# Patient Record
Sex: Female | Born: 1980 | Race: Black or African American | Hispanic: No | Marital: Single | Smoking: Never smoker
Health system: Southern US, Community
[De-identification: ages and names within clinical notes are randomized; demographics above are authoritative.]

## PROBLEM LIST (undated history)

## (undated) DIAGNOSIS — F419 Anxiety disorder, unspecified: Secondary | ICD-10-CM

## (undated) DIAGNOSIS — F329 Major depressive disorder, single episode, unspecified: Secondary | ICD-10-CM

## (undated) DIAGNOSIS — R569 Unspecified convulsions: Secondary | ICD-10-CM

## (undated) DIAGNOSIS — G40909 Epilepsy, unspecified, not intractable, without status epilepticus: Secondary | ICD-10-CM

## (undated) DIAGNOSIS — S329XXA Fracture of unspecified parts of lumbosacral spine and pelvis, initial encounter for closed fracture: Secondary | ICD-10-CM

## (undated) DIAGNOSIS — R002 Palpitations: Secondary | ICD-10-CM

## (undated) HISTORY — DX: Anxiety disorder, unspecified: F41.9

## (undated) HISTORY — PX: HERNIA REPAIR: SHX51

## (undated) HISTORY — DX: Major depressive disorder, single episode, unspecified: F32.9

## (undated) HISTORY — DX: Fracture of unspecified parts of lumbosacral spine and pelvis, initial encounter for closed fracture: S32.9XXA

## (undated) HISTORY — DX: Palpitations: R00.2

## (undated) HISTORY — PX: LEG SURGERY: SHX1003

## (undated) HISTORY — DX: Epilepsy, unspecified, not intractable, without status epilepticus: G40.909

---
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
## 2003-03-17 ENCOUNTER — Other Ambulatory Visit: Payer: Self-pay

## 2004-02-22 ENCOUNTER — Emergency Department: Payer: Self-pay | Admitting: Emergency Medicine

## 2004-02-22 ENCOUNTER — Other Ambulatory Visit: Payer: Self-pay

## 2004-06-20 ENCOUNTER — Emergency Department: Payer: Self-pay | Admitting: General Practice

## 2004-09-20 ENCOUNTER — Observation Stay: Payer: Self-pay | Admitting: Unknown Physician Specialty

## 2004-10-18 ENCOUNTER — Observation Stay: Payer: Self-pay | Admitting: Unknown Physician Specialty

## 2004-12-09 ENCOUNTER — Observation Stay: Payer: Self-pay

## 2005-01-19 ENCOUNTER — Inpatient Hospital Stay: Payer: Self-pay | Admitting: Obstetrics & Gynecology

## 2005-03-21 ENCOUNTER — Other Ambulatory Visit: Payer: Self-pay

## 2005-03-21 ENCOUNTER — Emergency Department: Payer: Self-pay | Admitting: Emergency Medicine

## 2006-02-14 ENCOUNTER — Emergency Department: Payer: Self-pay | Admitting: Emergency Medicine

## 2006-05-28 ENCOUNTER — Emergency Department: Payer: Self-pay | Admitting: Emergency Medicine

## 2007-09-09 ENCOUNTER — Ambulatory Visit: Payer: Self-pay | Admitting: Internal Medicine

## 2007-11-14 ENCOUNTER — Ambulatory Visit: Payer: Self-pay | Admitting: Internal Medicine

## 2007-12-05 ENCOUNTER — Emergency Department: Payer: Self-pay

## 2007-12-05 ENCOUNTER — Other Ambulatory Visit: Payer: Self-pay

## 2008-05-13 ENCOUNTER — Emergency Department: Payer: Self-pay | Admitting: Emergency Medicine

## 2008-05-29 ENCOUNTER — Ambulatory Visit: Payer: Self-pay | Admitting: Internal Medicine

## 2010-08-01 ENCOUNTER — Emergency Department: Payer: Self-pay | Admitting: Emergency Medicine

## 2010-08-15 ENCOUNTER — Encounter: Payer: Self-pay | Admitting: Obstetrics and Gynecology

## 2010-08-19 ENCOUNTER — Emergency Department: Payer: Self-pay | Admitting: Emergency Medicine

## 2011-01-10 DIAGNOSIS — G40909 Epilepsy, unspecified, not intractable, without status epilepticus: Secondary | ICD-10-CM

## 2011-01-10 DIAGNOSIS — S329XXA Fracture of unspecified parts of lumbosacral spine and pelvis, initial encounter for closed fracture: Secondary | ICD-10-CM | POA: Insufficient documentation

## 2011-01-10 DIAGNOSIS — F32A Depression, unspecified: Secondary | ICD-10-CM

## 2011-01-10 DIAGNOSIS — F419 Anxiety disorder, unspecified: Secondary | ICD-10-CM | POA: Insufficient documentation

## 2011-01-10 HISTORY — DX: Fracture of unspecified parts of lumbosacral spine and pelvis, initial encounter for closed fracture: S32.9XXA

## 2011-01-10 HISTORY — DX: Anxiety disorder, unspecified: F41.9

## 2011-01-10 HISTORY — DX: Epilepsy, unspecified, not intractable, without status epilepticus: G40.909

## 2011-01-10 HISTORY — DX: Depression, unspecified: F32.A

## 2011-03-15 ENCOUNTER — Observation Stay: Payer: Self-pay | Admitting: Obstetrics and Gynecology

## 2011-03-21 ENCOUNTER — Ambulatory Visit: Payer: Self-pay | Admitting: Obstetrics & Gynecology

## 2011-03-22 ENCOUNTER — Inpatient Hospital Stay: Payer: Self-pay | Admitting: Obstetrics & Gynecology

## 2011-03-23 LAB — PATHOLOGY REPORT

## 2011-07-11 ENCOUNTER — Emergency Department: Payer: Self-pay | Admitting: *Deleted

## 2012-04-25 ENCOUNTER — Emergency Department: Payer: Self-pay | Admitting: Emergency Medicine

## 2012-08-03 ENCOUNTER — Emergency Department: Payer: Self-pay | Admitting: Emergency Medicine

## 2013-03-30 ENCOUNTER — Emergency Department: Payer: Self-pay | Admitting: Emergency Medicine

## 2014-06-03 ENCOUNTER — Emergency Department: Payer: Self-pay | Admitting: Emergency Medicine

## 2015-02-03 ENCOUNTER — Ambulatory Visit
Admission: EM | Admit: 2015-02-03 | Discharge: 2015-02-03 | Disposition: A | Discharge status: Home / Self Care | Payer: Managed Care, Other (non HMO) | Attending: Family Medicine | Admitting: Family Medicine

## 2015-02-03 DIAGNOSIS — J0100 Acute maxillary sinusitis, unspecified: Secondary | ICD-10-CM | POA: Diagnosis not present

## 2015-02-03 HISTORY — DX: Unspecified convulsions: R56.9

## 2015-02-03 MED ORDER — AMOXICILLIN-POT CLAVULANATE 875-125 MG PO TABS: 1.0000 | ORAL_TABLET | Freq: Two times a day (BID) | ORAL | Status: DC

## 2015-02-03 MED ORDER — IBUPROFEN 800 MG PO TABS: 800.0000 mg | ORAL_TABLET | Freq: Three times a day (TID) | ORAL | Status: DC | PRN

## 2015-02-03 NOTE — Discharge Instructions (Signed)
Take medication as prescribed. Rest. Drink plenty of water.   Follow up your primary care physician as needed. Return to Urgent for new or worsening concerns.   Sinusitis, Adult Sinusitis is redness, soreness, and inflammation of the paranasal sinuses. Paranasal sinuses are air pockets within the bones of your face. They are located beneath your eyes, in the middle of your forehead, and above your eyes. In healthy paranasal sinuses, mucus is able to drain out, and air is able to circulate through them by way of your nose. However, when your paranasal sinuses are inflamed, mucus and air can become trapped. This can allow bacteria and other germs to grow and cause infection. Sinusitis can develop quickly and last only a short time (acute) or continue over a long period (chronic). Sinusitis that lasts for more than 12 weeks is considered chronic. CAUSES Causes of sinusitis include:  Allergies.  Structural abnormalities, such as displacement of the cartilage that separates your nostrils (deviated septum), which can decrease the air flow through your nose and sinuses and affect sinus drainage.  Functional abnormalities, such as when the small hairs (cilia) that line your sinuses and help remove mucus do not work properly or are not present. SIGNS AND SYMPTOMS Symptoms of acute and chronic sinusitis are the same. The primary symptoms are pain and pressure around the affected sinuses. Other symptoms include:  Upper toothache.  Earache.  Headache.  Bad breath.  Decreased sense of smell and taste.  A cough, which worsens when you are lying flat.  Fatigue.  Fever.  Thick drainage from your nose, which often is green and may contain pus (purulent).  Swelling and warmth over the affected sinuses. DIAGNOSIS Your health care provider will perform a physical exam. During your exam, your health care provider may perform any of the following to help determine if you have acute sinusitis or  chronic sinusitis:  Look in your nose for signs of abnormal growths in your nostrils (nasal polyps).  Tap over the affected sinus to check for signs of infection.  View the inside of your sinuses using an imaging device that has a light attached (endoscope). If your health care provider suspects that you have chronic sinusitis, one or more of the following tests may be recommended:  Allergy tests.  Nasal culture. A sample of mucus is taken from your nose, sent to a lab, and screened for bacteria.  Nasal cytology. A sample of mucus is taken from your nose and examined by your health care provider to determine if your sinusitis is related to an allergy. TREATMENT Most cases of acute sinusitis are related to a viral infection and will resolve on their own within 10 days. Sometimes, medicines are prescribed to help relieve symptoms of both acute and chronic sinusitis. These may include pain medicines, decongestants, nasal steroid sprays, or saline sprays. However, for sinusitis related to a bacterial infection, your health care provider will prescribe antibiotic medicines. These are medicines that will help kill the bacteria causing the infection. Rarely, sinusitis is caused by a fungal infection. In these cases, your health care provider will prescribe antifungal medicine. For some cases of chronic sinusitis, surgery is needed. Generally, these are cases in which sinusitis recurs more than 3 times per year, despite other treatments. HOME CARE INSTRUCTIONS  Drink plenty of water. Water helps thin the mucus so your sinuses can drain more easily.  Use a humidifier.  Inhale steam 3-4 times a day (for example, sit in the bathroom with the shower  running).  Apply a warm, moist washcloth to your face 3-4 times a day, or as directed by your health care provider.  Use saline nasal sprays to help moisten and clean your sinuses.  Take medicines only as directed by your health care provider.  If  you were prescribed either an antibiotic or antifungal medicine, finish it all even if you start to feel better. SEEK IMMEDIATE MEDICAL CARE IF:  You have increasing pain or severe headaches.  You have nausea, vomiting, or drowsiness.  You have swelling around your face.  You have vision problems.  You have a stiff neck.  You have difficulty breathing.   This information is not intended to replace advice given to you by your health care provider. Make sure you discuss any questions you have with your health care provider.   Document Released: 03/27/2005 Document Revised: 04/17/2014 Document Reviewed: 04/11/2011 Elsevier Interactive Patient Education [employer].

## 2015-02-03 NOTE — ED Notes (Signed)
Started 1 week ago yesterday with sore throat. Slight laryngitis the next day. Progressed to sinus congestion and facial pressure. + productive yellow cough since Saturday. Not improving

## 2015-02-03 NOTE — ED Provider Notes (Signed)
[facility] Emergency Department Provider Note  ____________________________________________  Time seen: Approximately 1755 PM  I have reviewed the triage vital signs and the nursing notes.   HISTORY  Chief Complaint URI   HPI Shelby Myers is a 34 y.o. female presents for 8-9 days of runny nose, cough, congestion. Patient reports some sore throat as well. States cough is occasional and primarily associated with drainage in the back of throat. States constant sinus pressure over the last few days. Unrelieved by over-the-counter Tylenol cold and sinus as well as over-the-counter Mucinex. States that these medicines did tend to help but did not resolve symptoms. Denies known sick contacts. Denies known fever. Reports continues to drink fluids well however with slight decrease in appetite and states that foods just do not taste as well.  Denies chest pain or shortness of breath. Denies abdominal pain. Denies rash, dizziness, vision changes, weakness, neck or back pain. Reports current pain is 4 out of 10 described as a pressure in the front sinuses. Denies recent antibiotic use.   Past Medical History  Diagnosis Date  . Seizures (HCC)   States epilepsy. States controlled. States does not take medication for seizures as seizures very infrequent. States last seizure greater than 3 years ago.   There are no active problems to display for this patient.   History reviewed. No pertinent past surgical history.  Current Outpatient Rx  Name  Route  Sig  Dispense  Refill  .           Marland Kitchen             Allergies Benadryl  Family History  Problem Relation Age of Onset  . Diabetes Mother   . Hypertension Mother   . Asthma Mother   . Seizures Mother     Social History Social History  Substance Use Topics  . Smoking status: Never Smoker   . Smokeless tobacco: None  . Alcohol Use: No    Review of Systems Constitutional: No fever/chills Eyes: No visual  changes. ENT: positive runny nose, congestion, intermittent sore throat.  Cardiovascular: Denies chest pain. Respiratory: Denies shortness of breath. Positive intermittent cough.  Gastrointestinal: No abdominal pain.  No nausea, no vomiting.  No diarrhea.  No constipation. Genitourinary: Negative for dysuria. Musculoskeletal: Negative for back pain. Skin: Negative for rash. Neurological: Negative for headaches, focal weakness or numbness.  10-point ROS otherwise negative.  ____________________________________________   PHYSICAL EXAM:  VITAL SIGNS: ED Triage Vitals  Enc Vitals Group     BP 02/03/15 1657 114/96 mmHg     Pulse Rate 02/03/15 1657 54     Resp 02/03/15 1657 17     Temp 02/03/15 1657 98.7 F (37.1 C)     Temp Source 02/03/15 1657 Tympanic     SpO2 02/03/15 1657 100 %     Weight 02/03/15 1657 185 lb (83.915 kg)     Height 02/03/15 1657  (1.702 m)     Head Cir --      Peak Flow --      Pain Score 02/03/15 1700 6     Pain Loc --      Pain Edu? --      Excl. in GC? --     Constitutional: Alert and oriented. Well appearing and in no acute distress. Eyes: Conjunctivae are normal. PERRL. EOMI. Head: Atraumatic.Mod TTP bilateral maxillary sinuses and mild bilateral frontal sinuses, no swelling, no erythema.   Ears: no erythema, normal TMs bilaterally.  Nose: nasal congestion, nasal turbinate erythema and bogginess. Nares patent.   Mouth/Throat: Mucous membranes are moist.  Mild pharyngeal erythema, no tonsillar swelling or exudate. No uvular deviation or shift.  Neck: No stridor.  No cervical spine tenderness to palpation. Hematological/Lymphatic/Immunilogical: No cervical lymphadenopathy. Cardiovascular: Normal rate, regular rhythm. Grossly normal heart sounds.  Good peripheral circulation. Respiratory: Normal respiratory effort.  No retractions. Lungs CTAB. No wheezes, rales, or rhonchi.  Gastrointestinal: Soft and nontender. No distention. Normal Bowel  sounds.  No CVA tenderness. Musculoskeletal: No lower or upper extremity tenderness nor edema. Bilateral pedal pulses equal and easily palpated.   Neurologic:  Normal speech and language. No gross focal neurologic deficits are appreciated. No gait instability. Skin:  Skin is warm, dry and intact. No rash noted. Psychiatric: Mood and affect are normal. Speech and behavior are normal.  ____________________________________________   LABS (all labs ordered are listed, but only abnormal results are displayed)  Labs Reviewed - No data to display   INITIAL IMPRESSION / ASSESSMENT AND PLAN / ED COURSE  Pertinent labs & imaging results that were available during my care of the patient were reviewed by me and considered in my medical decision making (see chart for details).  Very well appearing. No acute distress. Presents for complaints of 8-9 days of runny nose, congestion and sinus pressure with intermittent cough, and sore throat. Will treat with oral Augmentin, prn ibuprofen and tylenol, rest and fluids. Discussed follow up with Primary care physician this week. Discussed follow up and return parameters including no resolution or any worsening concerns. Patient verbalized understanding and agreed to plan.   ____________________________________________   FINAL CLINICAL IMPRESSION(S) / ED DIAGNOSES  Final diagnoses:  Acute maxillary sinusitis, recurrence not specified       Renford DillsLindsey Kimberly Coye, NP 02/03/15 1906

## 2015-02-03 NOTE — ED Notes (Signed)
Remains waiting to be seen. States "has to be somewhere at 6:15pm". Providers notified.

## 2015-03-30 ENCOUNTER — Encounter: Payer: Self-pay | Admitting: Emergency Medicine

## 2015-03-30 ENCOUNTER — Ambulatory Visit
Admission: EM | Admit: 2015-03-30 | Discharge: 2015-03-30 | Disposition: A | Discharge status: Home / Self Care | Payer: Managed Care, Other (non HMO) | Attending: Family Medicine | Admitting: Family Medicine

## 2015-03-30 DIAGNOSIS — T7840XD Allergy, unspecified, subsequent encounter: Secondary | ICD-10-CM | POA: Diagnosis not present

## 2015-03-30 DIAGNOSIS — R21 Rash and other nonspecific skin eruption: Secondary | ICD-10-CM | POA: Diagnosis not present

## 2015-03-30 MED ORDER — METHYLPREDNISOLONE 4 MG PO TBPK: ORAL_TABLET | ORAL | Status: DC

## 2015-03-30 MED ORDER — FEXOFENADINE HCL 60 MG PO TABS: 60.0000 mg | ORAL_TABLET | Freq: Two times a day (BID) | ORAL | Status: DC

## 2015-03-30 NOTE — ED Notes (Signed)
Patient c/o itchy red rash around her mouth that started Saturday.

## 2015-03-30 NOTE — ED Provider Notes (Signed)
CSN: [id number]     Arrival date & time 03/30/15  1809 History   First MD Initiated Contact with Patient 03/30/15 1835     Chief Complaint  Patient presents with  . Rash   (Consider location/radiation/quality/duration/timing/severity/associated sxs/prior Treatment) HPI   34 year old female who presents with a three-day history of circumoral itching swelling and small bumps that occurred after she drank some blueberry punch that was alcoholic a party. She states that she had blueberry in the past but may have had other ingredients that she was unaware of. She states that she had become very flushed and had a rash over most of her entire face; and a nurse at the party applied cool compresses which seemed to help somewhat. However the burning and itching around her lips has persisted and she has noticed some very fine small bumps that are bothersome and embarrassing to her. She states she had no respiratory symptoms at all and her lips did not swell immensely. She is allergic to Benadryl which causes itching. She's been using only Chapstick to keep her lips moist.  Past Medical History  Diagnosis Date  . Seizures (HCC)    History reviewed. No pertinent past surgical history. Family History  Problem Relation Age of Onset  . Diabetes Mother   . Hypertension Mother   . Asthma Mother   . Seizures Mother    Social History  Substance Use Topics  . Smoking status: Never Smoker   . Smokeless tobacco: None  . Alcohol Use: No   OB History    No data available     Review of Systems  Constitutional: Negative for fever, chills, activity change and fatigue.  HENT: Positive for mouth sores.   Skin: Positive for rash.  All other systems reviewed and are negative.   Allergies  Benadryl  Home Medications   Prior to Admission medications   Medication Sig Start Date End Date Taking? Authorizing Provider  amoxicillin-clavulanate (AUGMENTIN) 875-125 MG tablet Take 1 tablet by mouth every 12  (twelve) hours. 02/03/15   Renford Dills, NP  fexofenadine (ALLEGRA) 60 MG tablet Take 1 tablet (60 mg total) by mouth 2 (two) times daily. 03/30/15   Lutricia Feil, PA-C  ibuprofen (ADVIL,MOTRIN) 800 MG tablet Take 1 tablet (800 mg total) by mouth every 8 (eight) hours as needed for mild pain or moderate pain. 02/03/15   Renford Dills, NP  methylPREDNISolone (MEDROL DOSEPAK) 4 MG TBPK tablet Take per package instructions 03/30/15   Lutricia Feil, PA-C   Meds Ordered and Administered this Visit  Medications - No data to display  BP 109/61 mmHg  Pulse 62  Temp(Src) 98.1 F (36.7 C) (Tympanic)  Resp 16  Ht  (1.702 m)  Wt 185 lb (83.915 kg)  BMI 28.97 kg/m2  SpO2 100%  LMP 03/30/2015 (Exact Date) No data found.   Physical Exam  Constitutional: She is oriented to person, place, and time. She appears well-developed and well-nourished. No distress.  HENT:  Head: Normocephalic and atraumatic.  Eyes: Pupils are equal, round, and reactive to light.  Neck: Normal range of motion. Neck supple.  Musculoskeletal: Normal range of motion. She exhibits no edema or tenderness.  Neurological: She is alert and oriented to person, place, and time.  Skin: Rash noted. She is not diaphoretic.  Examination of the patient's face shows no obvious rash grossly but upon closer observation shows some small papular nonvesicular circumoral fine rash present. No swelling of the lips. The mucosal surfaces are  normal and free of any rash. She has no excoriations. He is having no breathing problems.  Psychiatric: She has a normal mood and affect. Her behavior is normal. Judgment and thought content normal.  Nursing note and vitals reviewed.   ED Course  Procedures (including critical care time)  Labs Review Labs Reviewed - No data to display  Imaging Review No results found.   Visual Acuity Review  Right Eye Distance:   Left Eye Distance:   Bilateral Distance:    Right Eye Near:   Left  Eye Near:    Bilateral Near:         MDM   1. Allergic reaction, subsequent encounter    Discharge Medication List as of 03/30/2015  7:02 PM    START taking these medications   Details  fexofenadine (ALLEGRA) 60 MG tablet Take 1 tablet (60 mg total) by mouth 2 (two) times daily., Starting 03/30/2015, Until Discontinued, Normal    methylPREDNISolone (MEDROL DOSEPAK) 4 MG TBPK tablet Take per package instructions, Normal      Plan: 1. Test/x-ray results and diagnosis reviewed with patient 2. rx as per orders; risks, benefits, potential side effects reviewed with patient 3. Recommend supportive treatment with the use of Chapstick to keep her lips moist. I recommended a H2 blocker. Since she is allergic to Benadryl I have asked her to try Allegra. 4. F/u  with a dermatologist (Dr. Ebony CargoBenitez Graham) if she is not improved in several days.     Lutricia FeilWilliam P Franceska Strahm, PA-C 03/30/15 [phone]

## 2015-07-07 ENCOUNTER — Ambulatory Visit: Payer: Managed Care, Other (non HMO)

## 2015-07-07 ENCOUNTER — Encounter (INDEPENDENT_AMBULATORY_CARE_PROVIDER_SITE_OTHER): Payer: Managed Care, Other (non HMO) | Admitting: Podiatry

## 2015-07-07 DIAGNOSIS — M79673 Pain in unspecified foot: Secondary | ICD-10-CM

## 2015-07-07 NOTE — Progress Notes (Signed)
This encounter was created in error - please disregard.

## 2015-07-17 ENCOUNTER — Emergency Department
Admission: EM | Admit: 2015-07-17 | Discharge: 2015-07-17 | Disposition: A | Discharge status: Home / Self Care | Payer: Managed Care, Other (non HMO) | Attending: Emergency Medicine | Admitting: Emergency Medicine

## 2015-07-17 ENCOUNTER — Encounter: Payer: Self-pay | Admitting: *Deleted

## 2015-07-17 ENCOUNTER — Emergency Department: Payer: Managed Care, Other (non HMO)

## 2015-07-17 DIAGNOSIS — I739 Peripheral vascular disease, unspecified: Secondary | ICD-10-CM | POA: Diagnosis present

## 2015-07-17 DIAGNOSIS — T148XXA Other injury of unspecified body region, initial encounter: Secondary | ICD-10-CM

## 2015-07-17 DIAGNOSIS — M7981 Nontraumatic hematoma of soft tissue: Secondary | ICD-10-CM | POA: Insufficient documentation

## 2015-07-17 HISTORY — DX: Fracture of unspecified parts of lumbosacral spine and pelvis, initial encounter for closed fracture: S32.9XXA

## 2015-07-17 MED ORDER — CYCLOBENZAPRINE HCL 10 MG PO TABS: 10.0000 mg | ORAL_TABLET | Freq: Three times a day (TID) | ORAL | Status: AC | PRN

## 2015-07-17 MED ORDER — IBUPROFEN 800 MG PO TABS: 800.0000 mg | ORAL_TABLET | Freq: Three times a day (TID) | ORAL | Status: DC | PRN

## 2015-07-17 NOTE — ED Notes (Signed)
Pt states injury to R calf x 1 month ago, struck on medial aspect of R calf at time by a softball. Pt states she suffered mild bruising at time. Pt states third week after injury she began to have increased pain. Pt states now she is experiencing swelling to R calf and R ankle that she characterizes as extreme and has never experienced this before. Pt c/o decreased mobility related to pain and swelling.

## 2015-07-17 NOTE — ED Notes (Signed)
Per first nurse, pt currently in [place]

## 2015-07-17 NOTE — Discharge Instructions (Signed)
serSeroma A seroma is a collection of fluid that looks like swelling or a mass on the body. Seromas form on the body where tissue has been injured or cut. They are most common after surgeries. Seromas vary in size. Some are small and painless. Others may become large and cause pain or discomfort. Many seromas go away on their own; the fluid is naturally absorbed by the body. Some may require the fluid to be drained through medical procedures.  CAUSES  Seromas form as the result of damage to tissue or the removal of tissue. This tissue damage may occur during surgery or because of an injury or trauma. When tissue is disrupted or removed, empty space is created. The body's natural defense system causes fluid to enter the empty space and form a seroma. SYMPTOMS   Swelling at the site of a surgical cut (incision) or an injury.  Drainage of clear fluid at the surgery or injury site.  Possible discomfort or pain. DIAGNOSIS  Your health care provider will perform a physical exam. During the exam, the health care provider will press on the seroma using a hand or fingers (palpation). Various tests may be ordered to help confirm the diagnosis. These tests may include:  Blood tests.  Imaging tests such as ultrasonography or computed tomography (CT). TREATMENT  Sometimes seromas resolve on their own and drain naturally in the body. Your health care provider may monitor you to make sure the seroma does not cause any complications. If your seroma does not resolve on its own, treatment may include:  Using a needle to drain the fluid from the seroma (needle aspiration).  Inserting a flexible tube (catheter) to drain the fluid.  Applying a dressing, such as an elastic bandage or binder.  Use of antibiotic medicines if the seroma becomes infected.  In rare cases, surgery may be done to remove the seroma and repair the area. HOME CARE INSTRUCTIONS  Follow your health care provider's instructions  regarding activity levels and any limitations on movements.  Only take over-the-counter or prescription medicines as directed by your health care provider.  If your health care provider prescribes antibiotics, take them as directed. Finish them even if you start to feel better.  Check your seroma every day for redness, warmth, or yellow drainage.  Follow up with your health care provider as directed. SEEK MEDICAL CARE IF:  You develop a fever.  You have pain, tenderness, redness, or warmth at the site of the seroma.  You notice yellow drainage coming from the site of the seroma.  Your seroma is getting bigger.   This information is not intended to replace advice given to you by your health care provider. Make sure you discuss any questions you have with your health care provider.   Document Released: 07/22/2012 Document Revised: 04/17/2014 Document Reviewed: 07/22/2012 Elsevier Interactive Patient Education 2016 [employer] Hematoma A hematoma is a collection of blood under the skin, in an organ, in a body space, in a joint space, or in other tissue. The blood can clot to form a lump that you can see and feel. The lump is often firm and may sometimes become sore and tender. Most hematomas get better in a few days to weeks. However, some hematomas may be serious and require medical care. Hematomas can range in size from very small to very large. CAUSES  A hematoma can be caused by a blunt or penetrating injury. It can also be caused by spontaneous leakage from a blood  vessel under the skin. Spontaneous leakage from a blood vessel is more likely to occur in older people, especially those taking blood thinners. Sometimes, a hematoma can develop after certain medical procedures. SIGNS AND SYMPTOMS   A firm lump on the body.  Possible pain and tenderness in the area.  Bruising.Blue, dark blue, purple-red, or yellowish skin may appear at the site of the hematoma if the hematoma is  close to the surface of the skin. For hematomas in deeper tissues or body spaces, the signs and symptoms may be subtle. For example, an intra-abdominal hematoma may cause abdominal pain, weakness, fainting, and shortness of breath. An intracranial hematoma may cause a headache or symptoms such as weakness, trouble speaking, or a change in consciousness. DIAGNOSIS  A hematoma can usually be diagnosed based on your medical history and a physical exam. Imaging tests may be needed if your health care provider suspects a hematoma in deeper tissues or body spaces, such as the abdomen, head, or chest. These tests may include ultrasonography or a CT scan.  TREATMENT  Hematomas usually go away on their own over time. Rarely does the blood need to be drained out of the body. Large hematomas or those that may affect vital organs will sometimes need surgical drainage or monitoring. HOME CARE INSTRUCTIONS   Apply ice to the injured area:   Put ice in a plastic bag.   Place a towel between your skin and the bag.   Leave the ice on for 20 minutes, 2-3 times a day for the first 1 to 2 days.   After the first 2 days, switch to using warm compresses on the hematoma.   Elevate the injured area to help decrease pain and swelling. Wrapping the area with an elastic bandage may also be helpful. Compression helps to reduce swelling and promotes shrinking of the hematoma. Make sure the bandage is not wrapped too tight.   If your hematoma is on a lower extremity and is painful, crutches may be helpful for a couple days.   Only take over-the-counter or prescription medicines as directed by your health care provider. SEEK IMMEDIATE MEDICAL CARE IF:   You have increasing pain, or your pain is not controlled with medicine.   You have a fever.   You have worsening swelling or discoloration.   Your skin over the hematoma breaks or starts bleeding.   Your hematoma is in your chest or abdomen and you have  weakness, shortness of breath, or a change in consciousness.  Your hematoma is on your scalp (caused by a fall or injury) and you have a worsening headache or a change in alertness or consciousness. MAKE SURE YOU:   Understand these instructions.  Will watch your condition.  Will get help right away if you are not doing well or get worse.   This information is not intended to replace advice given to you by your health care provider. Make sure you discuss any questions you have with your health care provider.   Document Released: 11/09/2003 Document Revised: 11/27/2012 Document Reviewed: 09/04/2012 Elsevier Interactive Patient Education [employer]

## 2015-07-17 NOTE — ED Provider Notes (Signed)
[facility] Emergency Department Provider Note     Time seen: ----------------------------------------- 9:43 PM on 07/17/2015 -----------------------------------------    I have reviewed the triage vital signs and the nursing notes.   HISTORY  Chief Complaint Claudication    HPI Shelby Myers is a 35 y.o. female who presents to ER for right calf pain for the last month. Patient states it was struck in the medial aspect of the right calf at that time by softball, causing some bruising. Patient states the third week after the injury she had increased pain now she's noticed increased swelling to the right calf and ankle that she thinks is extreme. She denies history of same, complains of decreased mobility related to this.   Past Medical History  Diagnosis Date  . Seizures (HCC)   . Pelvic avulsion fracture [facility](HCC)     Patient Active Problem List   Diagnosis Date Noted  . Anxiety and depression 01/10/2011  . Fracture of pelvis (HCC) 01/10/2011  . Seizure disorder (HCC) 01/10/2011    History reviewed. No pertinent past surgical history.  Allergies Benadryl and Diphenhydramine hcl  Social History Social History  Substance Use Topics  . Smoking status: Never Smoker   . Smokeless tobacco: Never Used  . Alcohol Use: No    Review of Systems Constitutional: Negative for fever. Eyes: Negative for visual changes. ENT: Negative for sore throat. Cardiovascular: Negative for chest pain. Respiratory: Negative for shortness of breath. Gastrointestinal: Negative for abdominal pain, vomiting and diarrhea. Genitourinary: Negative for dysuria. Musculoskeletal: Positive for right leg swelling and pain Skin: Negative for rash. Neurological: Negative for headaches, focal weakness or numbness.  10-point ROS otherwise negative.  ____________________________________________   PHYSICAL EXAM:  VITAL SIGNS: ED Triage Vitals  Enc Vitals Group     BP  07/17/15 2044 126/80 mmHg     Pulse Rate 07/17/15 2044 65     Resp 07/17/15 2044 16     Temp 07/17/15 2044 98.9 F (37.2 C)     Temp Source 07/17/15 2044 Oral     SpO2 07/17/15 2044 100 %     Weight 07/17/15 2044 190 lb (86.183 kg)     Height 07/17/15 2044 5\' 7"  (1.702 m)     Head Cir --      Peak Flow --      Pain Score 07/17/15 2057 6     Pain Loc --      Pain Edu? --      Excl. in GC? --     Constitutional: Alert and oriented. Well appearing and in no distress. Musculoskeletal: There is a small area on the right anterior medial calf that is tender, resembles hematoma Neurologic:  Normal speech and language. No gross focal neurologic deficits are appreciated.  Skin:  Skin is warm, dry and intact. Skin overlying the right lower extremity is normal Psychiatric: Mood and affect are normal ____________________________________________  ED COURSE:  Pertinent labs & imaging results that were available during my care of the patient were reviewed by me and considered in my medical decision making (see chart for details). Patient with posttraumatic right calf swelling, we will assess with ultrasound for DVT. ____________________________________________   RADIOLOGY Images were viewed by me  Ultrasound of the right lower extremity, right tib-fib x-rays IMPRESSION: No evidence of deep venous thrombosis.  There is a subcutaneous collection corresponding to the palpable abnormality, predominantly cystic. This could represent organizing hematoma or seroma. ____________________________________________  FINAL ASSESSMENT AND PLAN  Resolving Hematoma  Plan: Patient with imaging as dictated above. Patient is in no acute distress, x-rays are negative, ultrasound reveals a complex fluid collection that is not infectious. This is posttraumatic and is a resolving hematoma. Unfortunately treatment is limited at this time with Motrin, ice and elevation.   Emily Filbert,  MD   Emily Filbert, MD 07/17/15 [phone]

## 2015-07-17 NOTE — ED Notes (Signed)
Portable xray at bedside.

## 2015-07-31 ENCOUNTER — Encounter (HOSPITAL_COMMUNITY): Payer: Self-pay | Admitting: Family Medicine

## 2015-07-31 ENCOUNTER — Emergency Department (HOSPITAL_COMMUNITY)
Admission: EM | Admit: 2015-07-31 | Discharge: 2015-07-31 | Disposition: A | Discharge status: Home / Self Care | Payer: Managed Care, Other (non HMO) | Attending: Emergency Medicine | Admitting: Emergency Medicine

## 2015-07-31 ENCOUNTER — Emergency Department (HOSPITAL_COMMUNITY): Payer: Managed Care, Other (non HMO)

## 2015-07-31 DIAGNOSIS — Y9389 Activity, other specified: Secondary | ICD-10-CM | POA: Insufficient documentation

## 2015-07-31 DIAGNOSIS — Y998 Other external cause status: Secondary | ICD-10-CM | POA: Insufficient documentation

## 2015-07-31 DIAGNOSIS — S8011XA Contusion of right lower leg, initial encounter: Secondary | ICD-10-CM | POA: Insufficient documentation

## 2015-07-31 DIAGNOSIS — Z791 Long term (current) use of non-steroidal anti-inflammatories (NSAID): Secondary | ICD-10-CM | POA: Insufficient documentation

## 2015-07-31 DIAGNOSIS — W228XXA Striking against or struck by other objects, initial encounter: Secondary | ICD-10-CM | POA: Diagnosis not present

## 2015-07-31 DIAGNOSIS — S8991XA Unspecified injury of right lower leg, initial encounter: Secondary | ICD-10-CM | POA: Diagnosis present

## 2015-07-31 DIAGNOSIS — Y9289 Other specified places as the place of occurrence of the external cause: Secondary | ICD-10-CM | POA: Insufficient documentation

## 2015-07-31 MED ORDER — DICLOFENAC SODIUM 50 MG PO TBEC: 50.0000 mg | DELAYED_RELEASE_TABLET | Freq: Two times a day (BID) | ORAL | Status: DC

## 2015-07-31 MED ORDER — HYDROCODONE-ACETAMINOPHEN 5-325 MG PO TABS
1.0000 | ORAL_TABLET | Freq: Once | ORAL | Status: AC
  Administered 2015-07-31: 1 via ORAL
  Filled 2015-07-31: qty 1

## 2015-07-31 NOTE — Discharge Instructions (Signed)
Your x-ray today does not show any broken bone. Apply ice to the area, elevate and take the medication for pain as directed. Follow up with the orthopedic doctor if symptoms persist.

## 2015-07-31 NOTE — ED Notes (Signed)
Pt here for RLE pain and swelling since being injury this afternoon. sts hit on a cooler when she jumped up really fast. sts injured same leg a few weeks ago with a softball. Pt calf swollen in bruised.

## 2015-07-31 NOTE — ED Provider Notes (Signed)
CSN: [id number]     Arrival date & time 07/31/15  1342 History   First MD Initiated Contact with Patient 07/31/15 1422     Chief Complaint  Patient presents with  . Leg Pain     (Consider location/radiation/quality/duration/timing/severity/associated sxs/prior Treatment) Patient is a 35 y.o. female presenting with leg pain. The history is provided by the patient. No language interpreter was used.  Leg Pain Location:  Leg Injury: yes   Pain details:    Quality:  Sharp and shooting   Onset quality:  Sudden   Timing:  Constant   Progression:  Worsening Chronicity:  New Dislocation: no   Foreign body present:  No foreign bodies Prior injury to area:  Yes Relieved by:  Nothing Worsened by:  Bearing weight Ineffective treatments:  None tried Associated symptoms: swelling    Shelby Myers is a 35 y.o. female who presents to the ED with right lower extremity pain after she hit her leg on a cooler earlier today. She reports that she jumped up really fast and hit her leg. She injured the same leg several weeks ago and about 3 weeks after the injury went to [facility] and had DVT study that was normal. She complains of lower leg pain with swelling.    Past Medical History  Diagnosis Date  . Seizures (HCC)   . Pelvic avulsion fracture (HCC)    History reviewed. No pertinent past surgical history. Family History  Problem Relation Age of Onset  . Diabetes Mother   . Hypertension Mother   . Asthma Mother   . Seizures Mother    Social History  Substance Use Topics  . Smoking status: Never Smoker   . Smokeless tobacco: Never Used  . Alcohol Use: No   OB History    No data available     Review of Systems  Respiratory: Negative for shortness of breath.   Cardiovascular: Negative for chest pain.   Negative except as stated in HPI and PMH   Allergies  Benadryl and Diphenhydramine hcl  Home Medications   Prior to Admission medications   Medication Sig Start Date End Date  Taking? Authorizing Provider  cyclobenzaprine (FLEXERIL) 10 MG tablet Take 1 tablet (10 mg total) by mouth every 8 (eight) hours as needed for muscle spasms. 07/17/15 07/16/16  Emily Filbert, MD  diclofenac (VOLTAREN) 50 MG EC tablet Take 1 tablet (50 mg total) by mouth 2 (two) times daily. 07/31/15   Kayle Passarelli Orlene Och, NP  fexofenadine (ALLEGRA) 60 MG tablet Take 1 tablet (60 mg total) by mouth 2 (two) times daily. 03/30/15   Chrissie Noa Roemer, PA-C   BP 118/49 mmHg  Pulse 55  Temp(Src) 98.7 F (37.1 C) (Oral)  Resp 18  SpO2 100%  LMP 06/29/2015 Physical Exam  Constitutional: She is oriented to person, place, and time. She appears well-developed and well-nourished.  HENT:  Head: Normocephalic.  Eyes: Conjunctivae and EOM are normal.  Neck: Normal range of motion. Neck supple.  Cardiovascular: Normal rate.   Pulmonary/Chest: Effort normal. No respiratory distress.  Musculoskeletal:       Right lower leg: She exhibits tenderness and swelling. She exhibits no laceration.       Legs: There is an area of swelling and tenderness to the anterior aspect of the right lower leg. Full range of motion of the right lower extremity without difficulty. Pedal pulses 2+, adequate circulation, good touch sensation.   Neurological: She is alert and oriented to person, place, and  time. No cranial nerve deficit.  Skin: Skin is warm and dry.  Psychiatric: She has a normal mood and affect. Her behavior is normal.  Nursing note and vitals reviewed.   ED Course  Procedures (including critical care time) Labs Review Labs Reviewed - No data to display  Imaging Review Dg Tibia/fibula Right  07/31/2015  CLINICAL DATA:  RIGHT lower extremity pain and swelling EXAM: RIGHT TIBIA AND FIBULA - 2 VIEW COMPARISON:  07/17/2015 FINDINGS: No fracture of the tibia or fibula. Knee joint and ankle joint appear normal on two views. IMPRESSION: No acute osseous abnormality. Electronically Signed   By: Genevive BiStewart  Edmunds M.D.    On: 07/31/2015 15:11   I discussed this case with Dr. Rubin PayorPickering.  MDM  35 y.o. female with pain and swelling of the right lower leg s/p injury stable for d/c without focal neuro deficits. Ice, NSAIDS, elevation and f/u with ortho. Discussed with the patient clinical and x-ray findings and all questioned fully answered.   Final diagnoses:  Contusion of right leg, initial encounter      [facility] Shweta Aman, NP 07/31/15 1620  Benjiman CoreNathan Pickering, MD 07/31/15 1626

## 2015-07-31 NOTE — ED Notes (Signed)
Declined W/C at D/C and was escorted to lobby by RN. 

## 2015-09-12 ENCOUNTER — Encounter: Payer: Self-pay | Admitting: Emergency Medicine

## 2015-09-12 ENCOUNTER — Emergency Department
Admission: EM | Admit: 2015-09-12 | Discharge: 2015-09-12 | Disposition: A | Discharge status: Home / Self Care | Payer: Managed Care, Other (non HMO) | Attending: Emergency Medicine | Admitting: Emergency Medicine

## 2015-09-12 ENCOUNTER — Emergency Department: Payer: Managed Care, Other (non HMO)

## 2015-09-12 DIAGNOSIS — R079 Chest pain, unspecified: Secondary | ICD-10-CM | POA: Diagnosis present

## 2015-09-12 DIAGNOSIS — F4322 Adjustment disorder with anxiety: Secondary | ICD-10-CM | POA: Diagnosis not present

## 2015-09-12 DIAGNOSIS — F418 Other specified anxiety disorders: Secondary | ICD-10-CM | POA: Insufficient documentation

## 2015-09-12 DIAGNOSIS — Z8669 Personal history of other diseases of the nervous system and sense organs: Secondary | ICD-10-CM | POA: Diagnosis not present

## 2015-09-12 DIAGNOSIS — F410 Panic disorder [episodic paroxysmal anxiety] without agoraphobia: Secondary | ICD-10-CM

## 2015-09-12 LAB — BASIC METABOLIC PANEL
ANION GAP: 4 — AB (ref 5–15)
BUN: 13 mg/dL (ref 6–20)
CHLORIDE: 110 mmol/L (ref 101–111)
CO2: 25 mmol/L (ref 22–32)
CREATININE: 0.69 mg/dL (ref 0.44–1.00)
Calcium: 9 mg/dL (ref 8.9–10.3)
Glucose, Bld: 93 mg/dL (ref 65–99)
POTASSIUM: 3.2 mmol/L — AB (ref 3.5–5.1)
Sodium: 139 mmol/L (ref 135–145)

## 2015-09-12 LAB — CBC
HCT: 37.9 % (ref 35.0–47.0)
Hemoglobin: 12.8 g/dL (ref 12.0–16.0)
MCH: 31.6 pg (ref 26.0–34.0)
MCHC: 33.8 g/dL (ref 32.0–36.0)
MCV: 93.5 fL (ref 80.0–100.0)
PLATELETS: 221 10*3/uL (ref 150–440)
RBC: 4.06 MIL/uL (ref 3.80–5.20)
RDW: 12.4 % (ref 11.5–14.5)
WBC: 7.7 10*3/uL (ref 3.6–11.0)

## 2015-09-12 LAB — TROPONIN I

## 2015-09-12 MED ORDER — ALPRAZOLAM 0.5 MG PO TABS: 0.5000 mg | ORAL_TABLET | Freq: Three times a day (TID) | ORAL | Status: AC | PRN

## 2015-09-12 MED ORDER — LORAZEPAM 2 MG/ML IJ SOLN
1.0000 mg | Freq: Once | INTRAMUSCULAR | Status: AC
  Administered 2015-09-12: 1 mg via INTRAVENOUS
  Filled 2015-09-12: qty 1

## 2015-09-12 NOTE — Discharge Instructions (Signed)
Adjustment Disorder Adjustment disorder is an unusually severe reaction to a stressful life event, such as the loss of a job or physical illness. The event may be any stressful event other than the loss of a loved one. Adjustment disorder may affect your feelings, your thinking, how you act, or a combination of these. It may interfere with personal relationships or with the way you are at work, school, or home. People with this disorder are at risk for suicide and substance abuse. They may develop a more serious mental disorder, such as major depressive disorder or post-traumatic stress disorder. SIGNS AND SYMPTOMS  Symptoms may include:  Sadness, depressed mood, or crying spells.  Loss of enjoyment.  Change in appetite or weight.  Sense of loss or hopelessness.  Thoughts of suicide.  Anxiety, worry, or nervousness.  Trouble sleeping.  Avoiding family and friends.  Poor school performance.  Fighting or vandalism.  Reckless driving.  Skipping school.  Poor work International aid/development workerperformance.  Ignoring bills. Symptoms of adjustment disorder start within 3 months of the stressful life event. They do not last more than 6 months after the event has ended. DIAGNOSIS  To make a diagnosis, your health care provider will ask about what has happened in your life and how it has affected you. He or she may also ask about your medical history and use of medicines, alcohol, and other substances. Your health care provider may do a physical exam and order lab tests or other studies. You may be referred to a mental health specialist for evaluation. TREATMENT  Treatment options include:  Counseling or talk therapy. Talk therapy is usually provided by mental health specialists.  Medicine. Certain medicines may help with depression, anxiety, and sleep.  Support groups. Support groups offer emotional support, advice, and guidance. They are made up of people who have had similar experiences. HOME CARE  INSTRUCTIONS  Keep all follow-up visits as directed by your health care provider. This is important.  Take medicines only as directed by your health care provider. SEEK MEDICAL CARE IF:  Your symptoms get worse.  SEEK IMMEDIATE MEDICAL CARE IF: You have serious thoughts about hurting yourself or someone else. MAKE SURE YOU:  Understand these instructions.  Will watch your condition.  Will get help right away if you are not doing well or get worse.   This information is not intended to replace advice given to you by your health care provider. Make sure you discuss any questions you have with your health care provider.   Document Released: 11/29/2005 Document Revised: 04/17/2014 Document Reviewed: 08/19/2013 Elsevier Interactive Patient Education 2016 [employer]  Panic Attacks Panic attacks are sudden, short feelings of great fear or discomfort. You may have them for no reason when you are relaxed, when you are uneasy (anxious), or when you are sleeping.  HOME CARE  Take all your medicines as told.  Check with your doctor before starting new medicines.  Keep all doctor visits. GET HELP IF:  You are not able to take your medicines as told.  Your symptoms do not get better.  Your symptoms get worse. GET HELP RIGHT AWAY IF:  Your attacks seem different than your normal attacks.  You have thoughts about hurting yourself or others.  You take panic attack medicine and you have a side effect. MAKE SURE YOU:  Understand these instructions.  Will watch your condition.  Will get help right away if you are not doing well or get worse.   This information is  not intended to replace advice given to you by your health care provider. Make sure you discuss any questions you have with your health care provider.   Document Released: 04/29/2010 Document Revised: 01/15/2013 Document Reviewed: 11/08/2012 Elsevier Interactive Patient Education [employer]

## 2015-09-12 NOTE — ED Notes (Signed)
Pt. States mother recently been moved into hospice care.  Pt.  States having chest pain that radiates up into throat.  Pt. States feeling these symptoms intermittently over the past 10 days Pt. Tearful in triage.

## 2015-09-12 NOTE — ED Provider Notes (Signed)
[facility] Emergency Department Provider Note  ____________________________________________   I have reviewed the triage vital signs and the nursing notes.   HISTORY  Chief Complaint Chest Pain    HPI Percival SpanishYolanda R Borello is a 35 y.o. female who presents today complaining of anxiety. She states that her mother is dying and in hospice care and retention thinks about her tox about it she gets very upset and anxious. She has no SI or HI. She does have a history of panic attacks especially when a friend of hers died many years ago which this reminds her of. The patient denies any ongoing chest pain, she states that she just feels "panicky". She states that this is been going on for 2 weeks ever since her mother got very sick. She states sometimes she knows she is breathing too quickly and has tingling to her mouth and fingers. Patient tearful and upset during this exam. Patient denies any history of PE or DVT in her self or her family. She has no history of early cardiac death in her family. She denies any exertional symptoms. She plays softball regularly without difficulty. Patient has had no recent travel denies exogenous estrogens denies recent surgery or other risk factors for PE or DVT. No leg swelling.      Past Medical History  Diagnosis Date  . Seizures (HCC)   . Pelvic avulsion fracture [facility](HCC)     Patient Active Problem List   Diagnosis Date Noted  . Anxiety and depression 01/10/2011  . Fracture of pelvis (HCC) 01/10/2011  . Seizure disorder (HCC) 01/10/2011    History reviewed. No pertinent past surgical history.  Current Outpatient Rx  Name  Route  Sig  Dispense  Refill  . cyclobenzaprine (FLEXERIL) 10 MG tablet   Oral   Take 1 tablet (10 mg total) by mouth every 8 (eight) hours as needed for muscle spasms.   30 tablet   1   . diclofenac (VOLTAREN) 50 MG EC tablet   Oral   Take 1 tablet (50 mg total) by mouth 2 (two) times daily.   15 tablet  0   . fexofenadine (ALLEGRA) 60 MG tablet   Oral   Take 1 tablet (60 mg total) by mouth 2 (two) times daily.   30 tablet   0     Allergies Benadryl and Diphenhydramine hcl  Family History  Problem Relation Age of Onset  . Diabetes Mother   . Hypertension Mother   . Asthma Mother   . Seizures Mother     Social History Social History  Substance Use Topics  . Smoking status: Never Smoker   . Smokeless tobacco: Never Used  . Alcohol Use: No    Review of Systems Constitutional: No fever/chills Eyes: No visual changes. ENT: No sore throat. No stiff neck no neck pain Cardiovascular: Denies chest pain. Respiratory: Denies shortness of breath. Gastrointestinal:   no vomiting.  No diarrhea.  No constipation. Genitourinary: Negative for dysuria. Musculoskeletal: Negative lower extremity swelling Skin: Negative for rash. Neurological: Negative for headaches, focal weakness or numbness. 10-point ROS otherwise negative.  ____________________________________________   PHYSICAL EXAM:  VITAL SIGNS: ED Triage Vitals  Enc Vitals Group     BP 09/12/15 2002 120/76 mmHg     Pulse Rate 09/12/15 2002 60     Resp 09/12/15 2002 18     Temp 09/12/15 2002 97.8 F (36.6 C)     Temp src --      SpO2 09/12/15 2002  99 %     Weight 09/12/15 2002 185 lb (83.915 kg)     Height 09/12/15 2002  (1.702 m)     Head Cir --      Peak Flow --      Pain Score 09/12/15 2005 7     Pain Loc --      Pain Edu? --      Excl. in GC? --     Constitutional: Alert and oriented. Well appearing and in no acute distress. Eyes: Conjunctivae are normal. PERRL. EOMI. Head: Atraumatic. Nose: No congestion/rhinnorhea. Mouth/Throat: Mucous membranes are moist.  Oropharynx non-erythematous. Neck: No stridor.   Nontender with no meningismus Cardiovascular: Normal rate, regular rhythm. Grossly normal heart sounds.  Good peripheral circulation. Respiratory: Normal respiratory effort.  No retractions.  Lungs CTAB. Abdominal: Soft and nontender. No distention. No guarding no rebound Back:  There is no focal tenderness or step off there is no midline tenderness there are no lesions noted. there is no CVA tenderness Musculoskeletal: No lower extremity tenderness. No joint effusions, no DVT signs strong distal pulses no edema Neurologic:  Normal speech and language. No gross focal neurologic deficits are appreciated.  Skin:  Skin is warm, dry and intact. No rash noted. Psychiatric: Mood and affect are normal. Speech and behavior are normal.  ____________________________________________   LABS (all labs ordered are listed, but only abnormal results are displayed)  Labs Reviewed  BASIC METABOLIC PANEL - Abnormal; Notable for the following:    Potassium 3.2 (*)    Anion gap 4 (*)    All other components within normal limits  CBC  TROPONIN I   ____________________________________________  EKG  I personally interpreted any EKGs ordered by me or triageNormal sinus rhythm rate 60 bpm no acute ST elevation or acute ST depression, no acute ischemic changes, borderline LAD ____________________________________________  RADIOLOGY  I reviewed any imaging ordered by me or triage that were performed during my shift and, if possible, patient and/or family made aware of any abnormal findings. ____________________________________________   PROCEDURES  Procedure(s) performed: None  Critical Care performed: None  ____________________________________________   INITIAL IMPRESSION / ASSESSMENT AND PLAN / ED COURSE  Pertinent labs & imaging results that were available during my care of the patient were reviewed by me and considered in my medical decision making (see chart for details).  Patient here with clear anxiety related feelings of being unwell. She is very anxious and upset. No evidence of ACS PE or dissection. No evidence of takotsubo , EKG is reassuring blood work is reassuring chest  x-ray is reassuring. We did some breathing together and she feels better with deep breathing. I will give her some anti-anxiety medication for the short-term and advised to get outpatient counseling. Patient will return to the emergency room for any new or worrisome symptoms as discussed customarily. Patient has a ride home and is advised not to drive on this medication. ____________________________________________   FINAL CLINICAL IMPRESSION(S) / ED DIAGNOSES  Final diagnoses:  None      This chart was dictated using voice recognition software.  Despite best efforts to proofread,  errors can occur which can change meaning.     Jeanmarie Plant, MD 09/12/15 2127

## 2015-09-12 NOTE — ED Notes (Signed)
Pt verbalizes understanding that she is not to drive after ativan administration. Pt states she has someone coming to pick her up and drive her home.

## 2016-01-19 DIAGNOSIS — R002 Palpitations: Secondary | ICD-10-CM | POA: Insufficient documentation

## 2016-01-19 HISTORY — DX: Palpitations: R00.2

## 2016-05-02 ENCOUNTER — Encounter (INDEPENDENT_AMBULATORY_CARE_PROVIDER_SITE_OTHER): Payer: Managed Care, Other (non HMO) | Admitting: Vascular Surgery

## 2016-10-10 ENCOUNTER — Other Ambulatory Visit: Payer: Self-pay

## 2016-10-12 ENCOUNTER — Ambulatory Visit: Payer: Self-pay | Admitting: General Surgery

## 2016-10-17 ENCOUNTER — Ambulatory Visit: Payer: Self-pay | Admitting: Surgery

## 2016-10-23 ENCOUNTER — Encounter: Payer: Self-pay | Admitting: Surgery

## 2016-10-23 ENCOUNTER — Telehealth: Payer: Self-pay

## 2016-10-23 ENCOUNTER — Ambulatory Visit (INDEPENDENT_AMBULATORY_CARE_PROVIDER_SITE_OTHER): Payer: Commercial Managed Care - PPO | Admitting: Surgery

## 2016-10-23 VITALS — BP 131/83 | HR 69 | Temp 98.4°F | Ht 67.0 in | Wt 218.8 lb

## 2016-10-23 DIAGNOSIS — L729 Follicular cyst of the skin and subcutaneous tissue, unspecified: Secondary | ICD-10-CM | POA: Diagnosis not present

## 2016-10-23 MED ORDER — OXYCODONE-ACETAMINOPHEN 5-325 MG PO TABS: 1.0000 | ORAL_TABLET | ORAL | 0 refills | Status: DC | PRN

## 2016-10-23 NOTE — Progress Notes (Signed)
10/23/2016  Reason for Visit:  Right lower extremity cyst  History of Present Illness: Shelby Myers is a 36 y.o. female who presents with a history of a right lower extremity cyst.  She had an injury to her right leg a long time ago while playing softball and she developed a fluid collection that underwent I&D in 04/2016.  This healed well but over the past few weeks, she's noticed more discomfort and swelling over the incision site and feels that the fluid collection is coming back again.  She reports some occasional drainage from the incision site of clear fluid.  Denies any fevers, chills, chest pain, shortness of breath.  Reports that she has some pain when she walks on her right leg which she compensates by putting less weight on it.   Past Medical History: Past Medical History:  Diagnosis Date  . Anxiety and depression 01/10/2011  . Fracture of pelvis (HCC) 01/10/2011  . Heart palpitations 01/19/2016  . Pelvic avulsion fracture (HCC)   . Seizure disorder (HCC) 01/10/2011  . Seizures (HCC)      Past Surgical History: --I&D right lower extremity  Home Medications: Prior to Admission medications   Medication Sig Start Date End Date Taking? Authorizing Provider  diclofenac (VOLTAREN) 50 MG EC tablet Take 1 tablet (50 mg total) by mouth 2 (two) times daily. 07/31/15  Yes Neese, Hope M, NP  fexofenadine (ALLEGRA) 60 MG tablet Take 1 tablet (60 mg total) by mouth 2 (two) times daily. 03/30/15  Yes Lutricia Feiloemer, William P, PA-C  venlafaxine XR (EFFEXOR-XR) 37.5 MG 24 hr capsule Take 37.5 mg by mouth daily. 01/14/16 01/13/17 Yes [provider]  oxyCODONE-acetaminophen (ROXICET) 5-325 MG tablet Take 1 tablet by mouth every 4 (four) hours as needed for severe pain. 10/23/16   Henrene DodgePiscoya, Keeva Reisen, MD    Allergies: Allergies  Allergen Reactions  . Diphenhydramine Hcl Hives    Social History:  reports that she has never smoked. She has never used smokeless tobacco. She reports that she does  not drink alcohol or use drugs.   Family History: Family History  Problem Relation Age of Onset  . Diabetes Mother   . Hypertension Mother   . Asthma Mother   . Seizures Mother   . COPD Mother     Review of Systems: Review of Systems  Constitutional: Negative for chills and fever.  HENT: Negative for hearing loss.   Respiratory: Negative for cough.   Cardiovascular: Negative for chest pain.  Gastrointestinal: Negative for abdominal pain, nausea and vomiting.  Genitourinary: Negative for dysuria.  Musculoskeletal: Negative for myalgias.  Skin: Negative for rash.  Neurological: Negative for dizziness.  Psychiatric/Behavioral: Negative for depression.  All other systems reviewed and are negative.   Physical Exam BP 131/83   Pulse 69   Temp 98.4 F (36.9 C) (Oral)   Ht 5\' 7"  (1.702 m)   Wt 99.2 kg (218 lb 12.8 oz)   LMP 09/23/2016   BMI 34.27 kg/m  CONSTITUTIONAL: No acute distress HEENT:  Normocephalic, atraumatic, extraocular motion intact. NECK: Trachea is midline, and there is no jugular venous distension.  RESPIRATORY:  Lungs are clear, and breath sounds are equal bilaterally. Normal respiratory effort without pathologic use of accessory muscles. CARDIOVASCULAR: Heart is regular without murmurs, gallops, or rubs. GI: The abdomen is soft, nondistended, nontender.  MUSCULOSKELETAL:  Normal muscle strength and tone in all four extremities.  No peripheral edema or cyanosis. SKIN:  Right lower extremity has an anterior wound measuring about  1 cm size, site of prior I&D.  No active drainage noted, but there is fluctuance underneath, with some discomfort with palpation.  No erythema or induration. NEUROLOGIC:  Motor and sensation is grossly normal.  Cranial nerves are grossly intact. PSYCH:  Alert and oriented to person, place and time. Affect is normal.  Laboratory Analysis: No results found for this or any previous visit (from the past 24 hour(s)).  Imaging: No results  found.  Assessment and Plan: [name] who presents with a subcutaneous cyst of the right lower extremity.  It does not appear infected but there is an area of fluctuance underneath the skin.  Given her history of hematoma and resulting I&D from infection, there is likely a recurrent infection or cyst formation.  Discussed with patient the need for possible I&D vs removal of the cyst.  The risks and benefits of the procedure were explained to the patient, including risk of bleeding, infection, and injury to surrounding structures and she's willing to proceed.  The right lower extremity was prepped and draped in usual sterile fashion. 1% lidocaine with epi was used to infiltrate the skin at the site of her prior I&D. Then we made a vertical 1 cm incision over the prior I&D site. After dissecting down the skin, it was noted that there was no active infection in the cyst wall was intact. At that point we decided to proceed with excision of the cyst. We ended up making an elliptical 2.5 cm x 1 cm incision encompassing this previous incision and I&D site and dissected down the subcutaneous tissue down to the cyst. The cyst was freed up from its subcutaneous attachments using both blunt and sharp dissection. The cyst was successfully removed and pressure is applied to achieve appropriate hemostasis. The cyst measured approximately 4.5 cm x 2.5 cm and was sent to pathology. The wound was then irrigated thoroughly and then was closed in 2 layers using 3-0 Vicryl and 3-0 nylon interrupted sutures. Bacitracin ointment was applied over the sutures and covered with dry gauze dressing and tape. The patient tolerated the procedure well and all counts were correct and shortness were appropriately disposed off at the end of the case.  Patient has been instructed to apply Neosporin or triple antibiotic ointment daily over her wound and cover with dry gauze and tape. She may shower.  Patient will follow up  next week for suture removal and wound check. No antibiotics are needed. She will receive a prescription for oxycodone.   Howie Ill, MD [facility]

## 2016-10-23 NOTE — Telephone Encounter (Signed)
[facility]. She stated specimen would be picked up tomorrow 10/24/16.

## 2016-10-23 NOTE — Patient Instructions (Signed)
You may shower tomorrow. Please change your dressing twice a day or as needed. Please apply antibiotic ointment over the wound and place dry dressing.  I f you develop redness, fever, or hot to touch please call our office. Please see your follow up appointment listed above.

## 2016-10-24 ENCOUNTER — Telehealth: Payer: Self-pay

## 2016-10-24 ENCOUNTER — Telehealth: Payer: Self-pay | Admitting: General Practice

## 2016-10-24 MED ORDER — IBUPROFEN 800 MG PO TABS: 800.0000 mg | ORAL_TABLET | Freq: Three times a day (TID) | ORAL | 0 refills | Status: DC | PRN

## 2016-10-24 NOTE — Telephone Encounter (Signed)
Patient is calling said she forgot to mention yesterday when she was here she can't take the oxycodone it makes her itch and is asking if there is another medication we can send in for her. Please call patient and advice.

## 2016-10-24 NOTE — Telephone Encounter (Signed)
Spoke with patient at this time. She is Allergisc to Oxycodiene and would like something called in.  Ibuprofen 800 mg sent to pharmacy at this time and patient was instructed to pick up.

## 2016-10-24 NOTE — Telephone Encounter (Signed)
Spec[facility] picked [facility].

## 2016-10-26 ENCOUNTER — Telehealth: Payer: Self-pay

## 2016-10-26 NOTE — Telephone Encounter (Signed)
Patient notified of pathology results. Reminded of follow up appointment as well.

## 2016-10-27 ENCOUNTER — Telehealth: Payer: Self-pay

## 2016-10-27 ENCOUNTER — Encounter: Payer: Self-pay | Admitting: Surgery

## 2016-10-27 NOTE — Telephone Encounter (Signed)
Patient called stating that bottom end of her incision opened slightly and there was a small amount of pinkish drainage.   She is applying antibiotic ointment and changing the dressing twice daily as instructed to do per Dr.Piscoya. She is also cleaning this area daily. She denies fever, or any pus drainage. There is no increased redness. She was instructed to continue with cleaning the incision by letting some soapy water run over and rinsing well but to not submerge in water.  Continue applying the ointment and dressing the wound and if it started to look as if infected to go to to emergency room over the weekend. Patient was instructed to call office on Monday if no better.

## 2016-10-28 ENCOUNTER — Encounter: Payer: Self-pay | Admitting: Emergency Medicine

## 2016-10-28 DIAGNOSIS — Y658 Other specified misadventures during surgical and medical care: Secondary | ICD-10-CM | POA: Insufficient documentation

## 2016-10-28 DIAGNOSIS — Z791 Long term (current) use of non-steroidal anti-inflammatories (NSAID): Secondary | ICD-10-CM | POA: Diagnosis not present

## 2016-10-28 DIAGNOSIS — Z79899 Other long term (current) drug therapy: Secondary | ICD-10-CM | POA: Insufficient documentation

## 2016-10-28 DIAGNOSIS — M79661 Pain in right lower leg: Secondary | ICD-10-CM | POA: Diagnosis present

## 2016-10-28 DIAGNOSIS — T814XXA Infection following a procedure, initial encounter: Secondary | ICD-10-CM | POA: Diagnosis not present

## 2016-10-28 LAB — CBC WITH DIFFERENTIAL/PLATELET
Basophils Absolute: 0 10*3/uL (ref 0–0.1)
Basophils Relative: 1 %
EOS ABS: 0.4 10*3/uL (ref 0–0.7)
Eosinophils Relative: 5 %
HCT: 36 % (ref 35.0–47.0)
HEMOGLOBIN: 12.4 g/dL (ref 12.0–16.0)
LYMPHS ABS: 2.4 10*3/uL (ref 1.0–3.6)
LYMPHS PCT: 33 %
MCH: 32.5 pg (ref 26.0–34.0)
MCHC: 34.4 g/dL (ref 32.0–36.0)
MCV: 94.5 fL (ref 80.0–100.0)
Monocytes Absolute: 0.5 10*3/uL (ref 0.2–0.9)
Monocytes Relative: 7 %
Neutro Abs: 4.1 10*3/uL (ref 1.4–6.5)
Neutrophils Relative %: 54 %
Platelets: 208 10*3/uL (ref 150–440)
RBC: 3.81 MIL/uL (ref 3.80–5.20)
RDW: 12.3 % (ref 11.5–14.5)
WBC: 7.5 10*3/uL (ref 3.6–11.0)

## 2016-10-28 LAB — COMPREHENSIVE METABOLIC PANEL
ALT: 17 U/L (ref 14–54)
AST: 16 U/L (ref 15–41)
Albumin: 3.7 g/dL (ref 3.5–5.0)
Alkaline Phosphatase: 82 U/L (ref 38–126)
Anion gap: 5 (ref 5–15)
BUN: 14 mg/dL (ref 6–20)
CHLORIDE: 108 mmol/L (ref 101–111)
CO2: 24 mmol/L (ref 22–32)
Calcium: 8.9 mg/dL (ref 8.9–10.3)
Creatinine, Ser: 0.7 mg/dL (ref 0.44–1.00)
Glucose, Bld: 95 mg/dL (ref 65–99)
POTASSIUM: 3.8 mmol/L (ref 3.5–5.1)
Sodium: 137 mmol/L (ref 135–145)
Total Bilirubin: 0.7 mg/dL (ref 0.3–1.2)
Total Protein: 7.2 g/dL (ref 6.5–8.1)

## 2016-10-28 NOTE — ED Triage Notes (Addendum)
Patient states that she had a cyst removed from her right lower leg on Monday. Patient states that she continues to have pain and swelling to right lower leg. Patient states that she has also started having redness and drainage at the incision. Patient with positive pedal pulse.

## 2016-10-29 ENCOUNTER — Emergency Department
Admission: EM | Admit: 2016-10-29 | Discharge: 2016-10-29 | Disposition: A | Discharge status: Home / Self Care | Payer: Commercial Managed Care - PPO | Attending: Emergency Medicine | Admitting: Emergency Medicine

## 2016-10-29 ENCOUNTER — Emergency Department: Payer: Commercial Managed Care - PPO

## 2016-10-29 DIAGNOSIS — T814XXA Infection following a procedure, initial encounter: Secondary | ICD-10-CM | POA: Diagnosis not present

## 2016-10-29 DIAGNOSIS — T8140XA Infection following a procedure, unspecified, initial encounter: Secondary | ICD-10-CM | POA: Insufficient documentation

## 2016-10-29 MED ORDER — CLINDAMYCIN HCL 300 MG PO CAPS: 300.0000 mg | ORAL_CAPSULE | Freq: Three times a day (TID) | ORAL | 0 refills | Status: DC

## 2016-10-29 MED ORDER — CLINDAMYCIN PHOSPHATE 600 MG/50ML IV SOLN
600.0000 mg | Freq: Once | INTRAVENOUS | Status: AC
  Administered 2016-10-29: 600 mg via INTRAVENOUS
  Filled 2016-10-29: qty 50

## 2016-10-29 MED ORDER — CEPHALEXIN 500 MG PO CAPS
500.0000 mg | ORAL_CAPSULE | Freq: Three times a day (TID) | ORAL | 0 refills | Status: DC
Start: 2016-10-29 — End: 2016-11-16

## 2016-10-29 MED ORDER — DEXTROSE 5 % IV SOLN
1.0000 g | INTRAVENOUS | Status: DC
  Administered 2016-10-29: 1 g via INTRAVENOUS
  Filled 2016-10-29: qty 10

## 2016-10-29 NOTE — ED Notes (Signed)
Pt transported to [facility]

## 2016-10-29 NOTE — Consult Note (Signed)
Date of Consultation:  10/29/2016  Requesting Physician:  Chiquita Loth, MD  Reason for Consultation:  Right lower extremity cellulitis  History of Present Illness: Shelby Myers is a 36 y.o. female recently seen in the office on 7/16 with right lower extremity subcutaneous cyst which was removed in the office. Pathology result came back as an epidermal inclusion cyst. Patient reports that she was doing well until yesterday 7/21 that she started developing some increasing tenderness over the right lower extremity wound. Today she noticed swelling of the extremity from the mid calf level down towards the ankle as well as worsening tenderness and erythema around that area. She denies having having any purulent drainage from the wound but due to her symptoms she presented to emergency room for further management. She denies any fevers or chills, nausea vomiting, chest pain, shortness of breath. Reports that nothing makes the tenderness any better and walking makes the tenderness worse.   Past Medical History: Past Medical History:  Diagnosis Date  . Anxiety and depression 01/10/2011  . Fracture of pelvis (HCC) 01/10/2011  . Heart palpitations 01/19/2016  . Pelvic avulsion fracture (HCC)   . Seizure disorder (HCC) 01/10/2011  . Seizures (HCC)      Past Surgical History: Past Surgical History:  Procedure Laterality Date  . CESAREAN SECTION    . LEG SURGERY      Home Medications: Prior to Admission medications   Medication Sig Start Date End Date Taking? Authorizing Provider  cephALEXin (KEFLEX) 500 MG capsule Take 1 capsule (500 mg total) by mouth 3 (three) times daily. 10/29/16   Irean Hong, MD  clindamycin (CLEOCIN) 300 MG capsule Take 1 capsule (300 mg total) by mouth 3 (three) times daily. 10/29/16   Irean Hong, MD  diclofenac (VOLTAREN) 50 MG EC tablet Take 1 tablet (50 mg total) by mouth 2 (two) times daily. 07/31/15   Janne Napoleon, NP  fexofenadine (ALLEGRA) 60 MG tablet Take 1  tablet (60 mg total) by mouth 2 (two) times daily. 03/30/15   Lutricia Feil, PA-C  ibuprofen (ADVIL,MOTRIN) 800 MG tablet Take 1 tablet (800 mg total) by mouth every 8 (eight) hours as needed. 10/24/16   Henrene Dodge, MD  oxyCODONE-acetaminophen (ROXICET) 5-325 MG tablet Take 1 tablet by mouth every 4 (four) hours as needed for severe pain. 10/23/16   Henrene Dodge, MD  venlafaxine XR (EFFEXOR-XR) 37.5 MG 24 hr capsule Take 37.5 mg by mouth daily. 01/14/16 01/13/17  [provider]    Allergies: Allergies  Allergen Reactions  . Diphenhydramine Hcl Hives  . Oxycodone     Social History:  reports that she has never smoked. She has never used smokeless tobacco. She reports that she does not drink alcohol or use drugs.   Family History: Family History  Problem Relation Age of Onset  . Diabetes Mother   . Hypertension Mother   . Asthma Mother   . Seizures Mother   . COPD Mother     Review of Systems: Review of Systems  Constitutional: Negative for chills and fever.  HENT: Negative for hearing loss.   Respiratory: Negative for cough and shortness of breath.   Cardiovascular: Positive for leg swelling. Negative for chest pain.  Gastrointestinal: Negative for abdominal pain, nausea and vomiting.  Genitourinary: Negative for dysuria.  Musculoskeletal: Negative for myalgias.  Skin:       Erythema of RLE  Neurological: Negative for dizziness.  Psychiatric/Behavioral: Negative for depression.  All other systems reviewed  and are negative.   Physical Exam BP 125/75 (BP Location: Right Arm)   Pulse 60   Temp 98.6 F (37 C)   Resp 18   Ht 5\' 7"  (1.702 m)   Wt 98.9 kg (218 lb)   LMP 09/22/2016 (Approximate)   SpO2 100%   BMI 34.14 kg/m  CONSTITUTIONAL: No acute distress HEENT:  Normocephalic, atraumatic, extraocular motion intact. NECK: Trachea is midline, and there is no jugular venous distension.  RESPIRATORY:  Lungs are clear, and breath sounds are equal  bilaterally. Normal respiratory effort without pathologic use of accessory muscles. CARDIOVASCULAR: Heart is regular without murmurs, gallops, or rubs. GI: The abdomen is soft, nondistended, nontender.  MUSCULOSKELETAL:  Normal muscle strength and tone in all four extremities.  No peripheral edema or cyanosis. SKIN: Right lower extremity is status post excision of subcutaneous cyst. There are nylon sutures on the outside of the skin. There is mild surrounding erythema going from the mid calf level down towards the ankle with associated swelling and tenderness to palpation. There is no significant drainage coming from the wound. Upon probing with a Q-tip between the sutures it appears that there is an old hematoma but no purulent drainage. One suture removed with wound packed with quarter-inch iodoform. NEUROLOGIC:  Motor and sensation is grossly normal.  Cranial nerves are grossly intact. PSYCH:  Alert and oriented to person, place and time. Affect is normal.  Laboratory Analysis: Results for orders placed or performed during the hospital encounter of 10/29/16 (from the past 24 hour(s))  Comprehensive metabolic panel     Status: None   Collection Time: 10/28/16  7:32 PM  Result Value Ref Range   Sodium 137 135 - 145 mmol/L   Potassium 3.8 3.5 - 5.1 mmol/L   Chloride 108 101 - 111 mmol/L   CO2 24 22 - 32 mmol/L   Glucose, Bld 95 65 - 99 mg/dL   BUN 14 6 - 20 mg/dL   Creatinine, Ser 0.98 0.44 - 1.00 mg/dL   Calcium 8.9 8.9 - 11.9 mg/dL   Total Protein 7.2 6.5 - 8.1 g/dL   Albumin 3.7 3.5 - 5.0 g/dL   AST 16 15 - 41 U/L   ALT 17 14 - 54 U/L   Alkaline Phosphatase 82 38 - 126 U/L   Total Bilirubin 0.7 0.3 - 1.2 mg/dL   GFR calc non Af Amer >60 >60 mL/min   GFR calc Af Amer >60 >60 mL/min   Anion gap 5 5 - 15  CBC with Differential     Status: None   Collection Time: 10/28/16  7:32 PM  Result Value Ref Range   WBC 7.5 3.6 - 11.0 K/uL   RBC 3.81 3.80 - 5.20 MIL/uL   Hemoglobin 12.4 12.0  - 16.0 g/dL   HCT 14.7 82.9 - 56.2 %   MCV 94.5 80.0 - 100.0 fL   MCH 32.5 26.0 - 34.0 pg   MCHC 34.4 32.0 - 36.0 g/dL   RDW 13.0 86.5 - 78.4 %   Platelets 208 150 - 440 K/uL   Neutrophils Relative % 54 %   Neutro Abs 4.1 1.4 - 6.5 K/uL   Lymphocytes Relative 33 %   Lymphs Abs 2.4 1.0 - 3.6 K/uL   Monocytes Relative 7 %   Monocytes Absolute 0.5 0.2 - 0.9 K/uL   Eosinophils Relative 5 %   Eosinophils Absolute 0.4 0 - 0.7 K/uL   Basophils Relative 1 %   Basophils Absolute 0.0 0 -  0.1 K/uL    Imaging: [place] Venous Img Lower Unilateral Right  Result Date: 10/29/2016 CLINICAL DATA:  Pain and swelling of the lower leg EXAM: Right LOWER EXTREMITY VENOUS DOPPLER ULTRASOUND TECHNIQUE: Gray-scale sonography with graded compression, as well as color Doppler and duplex ultrasound were performed to evaluate the lower extremity deep venous systems from the level of the common femoral vein and including the common femoral, femoral, profunda femoral, popliteal and calf veins including the posterior tibial, peroneal and gastrocnemius veins when visible. The superficial great saphenous vein was also interrogated. Spectral Doppler was utilized to evaluate flow at rest and with distal augmentation maneuvers in the common femoral, femoral and popliteal veins. COMPARISON:  None. FINDINGS: Contralateral Common Femoral Vein: Respiratory phasicity is normal and symmetric with the symptomatic side. No evidence of thrombus. Normal compressibility. Common Femoral Vein: No evidence of thrombus. Normal compressibility, respiratory phasicity and response to augmentation. Saphenofemoral Junction: No evidence of thrombus. Normal compressibility and flow on color Doppler imaging. Profunda Femoral Vein: No evidence of thrombus. Normal compressibility and flow on color Doppler imaging. Femoral Vein: No evidence of thrombus. Normal compressibility, respiratory phasicity and response to augmentation. Popliteal Vein: No evidence of  thrombus. Normal compressibility, respiratory phasicity and response to augmentation. Calf Veins: No evidence of thrombus. Normal compressibility and flow on color Doppler imaging. Superficial Great Saphenous Vein: No evidence of thrombus. Normal compressibility and flow on color Doppler imaging. Venous Reflux:  None. Other Findings:  None. IMPRESSION: No  DVT within the right lower extremity. Electronically Signed   By: Deatra RobinsonKevin  Herman M.D.   On: 10/29/2016 04:20    Assessment and Plan: [name] who presents with what appears to be infected hematoma of the right lower extremity wound. I have independently viewed the patient's imaging studies and reviewed the patient's laboratory studies. Overall there is no DVT in her white blood cell count is normal with mild cellulitis and infection of the right lower extremity wound with no purulent drainage.  Agree with emergency room and starting IV antibiotics here and discharging to home with oral antibiotic course for right lower extremity wound infection. Informed patient to keep the iodoform wick in place and presented to the clinic on 7/23 for wound evaluation wick removal. Patient is aware that should she may require more sutures of open based on how the wound appears to be in the office. Informed the patient to show up at 10 in the morning for further evaluation on 7/23. No further procedures needed here.   Howie IllJose Luis Neill Jurewicz, MD [facility]

## 2016-10-29 NOTE — ED Provider Notes (Signed)
[facility] Emergency Department Provider Note   ____________________________________________   First MD Initiated Contact with Patient 10/29/16 [phone]     (approximate)  I have reviewed the triage vital signs and the nursing notes.   HISTORY  Chief Complaint Post-op Problem    HPI Shelby Myers is a 36 y.o. female who presents to the ED from home with a chief complaint of right lower leg pain and swelling. Patient had a cyst removed on 7/16 by Dr. Aleen CampiPiscoya.Noted slight drainage from the bottom of her incision, redness and swelling which started one day ago. Swelling and pain progressed over the day. Denies associated fever, chills, chest pain, shortness of breath, abdominal pain, nausea, vomiting. Nothing makes her symptoms better. Movement and ambulation make her symptoms worse.   Past Medical History:  Diagnosis Date  . Anxiety and depression 01/10/2011  . Fracture of pelvis (HCC) 01/10/2011  . Heart palpitations 01/19/2016  . Pelvic avulsion fracture (HCC)   . Seizure disorder (HCC) 01/10/2011  . Seizures [facility](HCC)     Patient Active Problem List   Diagnosis Date Noted  . Heart palpitations 01/19/2016  . Anxiety and depression 01/10/2011  . Fracture of pelvis (HCC) 01/10/2011  . Seizure disorder (HCC) 01/10/2011    Past Surgical History:  Procedure Laterality Date  . CESAREAN SECTION    . LEG SURGERY      Prior to Admission medications   Medication Sig Start Date End Date Taking? Authorizing Provider  diclofenac (VOLTAREN) 50 MG EC tablet Take 1 tablet (50 mg total) by mouth 2 (two) times daily. 07/31/15   Janne NapoleonNeese, Hope M, NP  fexofenadine (ALLEGRA) 60 MG tablet Take 1 tablet (60 mg total) by mouth 2 (two) times daily. 03/30/15   Lutricia Feiloemer, William P, PA-C  ibuprofen (ADVIL,MOTRIN) 800 MG tablet Take 1 tablet (800 mg total) by mouth every 8 (eight) hours as needed. 10/24/16   Henrene DodgePiscoya, Jose, MD  oxyCODONE-acetaminophen (ROXICET) 5-325 MG tablet Take  1 tablet by mouth every 4 (four) hours as needed for severe pain. 10/23/16   Henrene DodgePiscoya, Jose, MD  venlafaxine XR (EFFEXOR-XR) 37.5 MG 24 hr capsule Take 37.5 mg by mouth daily. 01/14/16 01/13/17  [provider]    Allergies Diphenhydramine hcl and Oxycodone  Family History  Problem Relation Age of Onset  . Diabetes Mother   . Hypertension Mother   . Asthma Mother   . Seizures Mother   . COPD Mother     Social History Social History  Substance Use Topics  . Smoking status: Never Smoker  . Smokeless tobacco: Never Used  . Alcohol use No    Review of Systems  Constitutional: No fever/chills. Eyes: No visual changes. ENT: No sore throat. Cardiovascular: Denies chest pain. Respiratory: Denies shortness of breath. Gastrointestinal: No abdominal pain.  No nausea, no vomiting.  No diarrhea.  No constipation. Genitourinary: Negative for dysuria. Musculoskeletal: Positive for right lower leg pain and swelling. Negative for back pain. Skin: Positive for right lower leg redness. Negative for rash. Neurological: Negative for headaches, focal weakness or numbness.   ____________________________________________   PHYSICAL EXAM:  VITAL SIGNS: ED Triage Vitals  Enc Vitals Group     BP 10/28/16 1931 120/84     Pulse Rate 10/28/16 1929 66     Resp 10/28/16 1929 18     Temp 10/28/16 1929 98.6 F (37 C)     Temp src --      SpO2 10/28/16 1929 99 %  Weight 10/28/16 1929 218 lb (98.9 kg)     Height 10/28/16 1929 5\' 7"  (1.702 m)     Head Circumference --      Peak Flow --      Pain Score 10/28/16 1929 7     Pain Loc --      Pain Edu? --      Excl. in GC? --     Constitutional: Alert and oriented. Well appearing and in no acute distress. Eyes: Conjunctivae are normal. PERRL. EOMI. Head: Atraumatic. Nose: No congestion/rhinnorhea. Mouth/Throat: Mucous membranes are moist.  Oropharynx non-erythematous. Neck: No stridor.   Cardiovascular: Normal rate, regular rhythm.  Grossly normal heart sounds.  Good peripheral circulation. Respiratory: Normal respiratory effort.  No retractions. Lungs CTAB. Gastrointestinal: Soft and nontender. No distention. No abdominal bruits. No CVA tenderness. Musculoskeletal: Right lower leg swollen. Sutures tight with some purulent drainage. Area is erythematous and warm to the touch. 2+ pedal pulses. Calf is swollen but supple. Brisk, less than 5 second capillary refill. Neurologic:  Normal speech and language. No gross focal neurologic deficits are appreciated.  Skin:  Skin is warm, dry and intact. No rash noted. Psychiatric: Mood and affect are normal. Speech and behavior are normal.  ____________________________________________   LABS (all labs ordered are listed, but only abnormal results are displayed)  Labs Reviewed  CULTURE, BLOOD (ROUTINE X 2)  CULTURE, BLOOD (ROUTINE X 2)  COMPREHENSIVE METABOLIC PANEL  CBC WITH DIFFERENTIAL/PLATELET   ____________________________________________  EKG  None ____________________________________________  RADIOLOGY  US Venous Img Lower Unilateral Right  Result Date: 10/29/2016 CLINICAL DATA:  Pain and swelling of the lower leg EXAM: Right LOWER EXTREMITY VENOUS DOPPLER ULTRASOUND TECHNIQUE: Gray-scale sonography with graded compression, as well as color Doppler and duplex ultrasound were performed to evaluate the lower extremity deep venous systems from the level of the common femoral vein and including the common femoral, femoral, profunda femoral, popliteal and calf veins including the posterior tibial, peroneal and gastrocnemius veins when visible. The superficial great saphenous vein was also interrogated. Spectral Doppler was utilized to evaluate flow at rest and with distal augmentation maneuvers in the common femoral, femoral and popliteal veins. COMPARISON:  None. FINDINGS: Contralateral Common Femoral Vein: Respiratory phasicity is normal and symmetric with the symptomatic  side. No evidence of thrombus. Normal compressibility. Common Femoral Vein: No evidence of thrombus. Normal compressibility, respiratory phasicity and response to augmentation. Saphenofemoral Junction: No evidence of thrombus. Normal compressibility and flow on color Doppler imaging. Profunda Femoral Vein: No evidence of thrombus. Normal compressibility and flow on color Doppler imaging. Femoral Vein: No evidence of thrombus. Normal compressibility, respiratory phasicity and response to augmentation. Popliteal Vein: No evidence of thrombus. Normal compressibility, respiratory phasicity and response to augmentation. Calf Veins: No evidence of thrombus. Normal compressibility and flow on color Doppler imaging. Superficial Great Saphenous Vein: No evidence of thrombus. Normal compressibility and flow on color Doppler imaging. Venous Reflux:  None. Other Findings:  None. IMPRESSION: No  DVT within the right lower extremity. Electronically Signed   By: Deatra Robinson M.D.   On: 10/29/2016 04:20    ____________________________________________   PROCEDURES  Procedure(s) performed: None  Procedures  Critical Care performed: No  ____________________________________________   INITIAL IMPRESSION / ASSESSMENT AND PLAN / ED COURSE  Pertinent labs & imaging results that were available during my care of the patient were reviewed by me and considered in my medical decision making (see chart for details).  36 year old female who presents with right lower leg  cellulitis status post cyst removal. She is afebrile with normal white count. Will obtain blood cultures, administer IV antibiotics, obtain ultrasound to evaluate for DVT. Dr. Aleen Campi on call and will examine patient in the emergency department.  Clinical Course as of Oct 30 439  Sun Oct 29, 2016  0132 Dr. Aleen Campi evaluated patient at bedside. Agrees with Keflex and clindamycin for antibiotics. Patient will be seen in the surgery clinic at 10 AM Monday.   [JS]  0320 Patient asleep. Surgeon was at bedside when ultrasound initially tried to take patient. Patient should be able to get ultrasound shortly.  [JS]  0440 Updated patient of negative ultrasound result. Will discharge home on antibiotics, analgesia and patient will follow-up with surgery on Monday. Strict return precautions given. Patient verbalizes understanding and agrees with plan of care.  [JS]    Clinical Course User Index [JS] Irean Hong, MD     ____________________________________________   FINAL CLINICAL IMPRESSION(S) / ED DIAGNOSES  Final diagnoses:  Postoperative infection, initial encounter      NEW MEDICATIONS STARTED DURING THIS VISIT:  New Prescriptions   No medications on file     Note:  This document was prepared using Dragon voice recognition software and may include unintentional dictation errors.    Irean Hong, MD 10/29/16 [phone]

## 2016-10-29 NOTE — Discharge Instructions (Signed)
1. Take antibiotics as prescribed: Keflex 500 mg 3 times daily 7 days Clindamycin 300 mg 3 times daily 10 days 2. You may take ibuprofen as needed for discomfort. 3. Elevate affected area several times daily. 4. Return to the ER for worsening symptoms, fever, persistent vomiting or other concerns.

## 2016-10-30 ENCOUNTER — Telehealth: Payer: Self-pay

## 2016-10-30 ENCOUNTER — Ambulatory Visit: Payer: Self-pay | Admitting: Surgery

## 2016-10-30 NOTE — Telephone Encounter (Signed)
Spoke with patient at this time and she is uable to come in today due to work. We discussed her removing the wick later this evening and redressing the wound.  Patient will be at appointment on 11/01/16.

## 2016-10-30 NOTE — Telephone Encounter (Signed)
Contacted patient at this time to see if she was coming in for her 10AM office visit that Dr. Aleen CampiPiscoya asked to be scheduled today. She stated that she was unable to come due to work. I asked if the incision was red, draining, and still swollen. She stated that it has been draining and that she has been changing the gauzes at night and early morning. She also stated that the redness has went away and that the swelling has went down but still some there. She also stated that has started that antibiotics and will keep the area clean as well as watch for any increased swelling or redness. I reminded patient of her appointment on 7/25 at 4PM. Patient verbalized understanding at this time.

## 2016-11-01 ENCOUNTER — Encounter: Payer: Self-pay | Admitting: Surgery

## 2016-11-01 ENCOUNTER — Ambulatory Visit (INDEPENDENT_AMBULATORY_CARE_PROVIDER_SITE_OTHER): Payer: Commercial Managed Care - PPO | Admitting: Surgery

## 2016-11-01 VITALS — BP 122/77 | HR 66 | Temp 98.9°F | Ht 67.0 in | Wt 220.6 lb

## 2016-11-01 DIAGNOSIS — Z09 Encounter for follow-up examination after completed treatment for conditions other than malignant neoplasm: Secondary | ICD-10-CM

## 2016-11-01 MED ORDER — FLUCONAZOLE 100 MG PO TABS: 100.0000 mg | ORAL_TABLET | Freq: Every day | ORAL | 1 refills | Status: DC

## 2016-11-01 NOTE — Patient Instructions (Addendum)
  Please continue to keep area  Clean. Please change dressing twice daily or as needed.  You may want to apply ice for the swelling. Please keep your leg elevated above heart level to help reduce the swelling.  Please continue to take your antibiotic.

## 2016-11-02 NOTE — Progress Notes (Signed)
S/p right leg cyst excision by Dr. Aleen CampiPiscoya developed cellulitis and placed on A/B Still having some pain U/S neg DVT  PE NAD Ext: one stitch removed, it did had some edema, I was able to drained a medium size clot from retained hematoma. Significant relieve  A/P Resolving cellulitis and hematoma Keep two stitches for now  F/U w Dr. Aleen CampiPiscoya

## 2016-11-03 LAB — CULTURE, BLOOD (ROUTINE X 2)
Culture: NO GROWTH
Culture: NO GROWTH
SPECIAL REQUESTS: ADEQUATE

## 2016-11-09 ENCOUNTER — Encounter: Payer: Self-pay | Admitting: Surgery

## 2016-11-10 ENCOUNTER — Encounter: Payer: Self-pay | Admitting: Surgery

## 2016-11-15 ENCOUNTER — Ambulatory Visit (INDEPENDENT_AMBULATORY_CARE_PROVIDER_SITE_OTHER): Payer: Commercial Managed Care - PPO | Admitting: General Surgery

## 2016-11-15 VITALS — BP 120/81 | HR 75 | Temp 98.1°F | Ht 67.0 in | Wt 216.0 lb

## 2016-11-15 DIAGNOSIS — Z4889 Encounter for other specified surgical aftercare: Secondary | ICD-10-CM

## 2016-11-16 ENCOUNTER — Encounter: Payer: Self-pay | Admitting: General Surgery

## 2016-11-16 NOTE — Patient Instructions (Signed)
We will have you follow-up once again next week with Dr. Everlene FarrierPabon. Please see appointment below.  Continue to change dressing with dry bandage three times daily and when soiled.   If you have any questions or concerns prior to your appointment, please call our office and speak with a nurse.

## 2016-11-16 NOTE — Progress Notes (Signed)
Outpatient Surgical Follow Up  11/16/2016  [name] is an 36 y.o. female.   Chief Complaint  Patient presents with  . Routine Post Op    Right Lower Extremity Mass Removal (10/23/16)- Dr. Aleen CampiPiscoya    HPI: 36 year old female returns to clinic for follow-up for wound care to her right lower extremity surgery site. Patient reports continuing to do 3 times a day dressing changes due to saturation. She states it is healing well though and she has completed her antibiotic therapy. She denies any fevers, chills, nausea, vomiting, chest pain, shortness of breath, diarrhea, constipation.  Past Medical History:  Diagnosis Date  . Anxiety and depression 01/10/2011  . Fracture of pelvis (HCC) 01/10/2011  . Heart palpitations 01/19/2016  . Pelvic avulsion fracture (HCC)   . Seizure disorder (HCC) 01/10/2011  . Seizures (HCC)     Past Surgical History:  Procedure Laterality Date  . CESAREAN SECTION    . LEG SURGERY      Family History  Problem Relation Age of Onset  . Diabetes Mother   . Hypertension Mother   . Asthma Mother   . Seizures Mother   . COPD Mother     Social History:  reports that she has never smoked. She has never used smokeless tobacco. She reports that she does not drink alcohol or use drugs.  Allergies:  Allergies  Allergen Reactions  . Diphenhydramine Hcl Hives  . Oxycodone     Medications reviewed.    ROS A multipoint review of systems was completed, all pertinent positives and negatives are documented within the history of present illness and remainder are negative   BP 120/81   Pulse 75   Temp 98.1 F (36.7 C) (Oral)   Ht 5\' 7"  (1.702 m)   Wt 98 kg (216 lb)   BMI 33.83 kg/m   Physical Exam Gen.: No acute distress Chest: Clear to auscultation Heart: Regular rhythm Abdomen: Soft and nontender Extremity: Right lower extremity wound evaluated with a seropurulent appearance to the superficial tissues. No evidence of spreading  erythema.    No results found for this or any previous visit (from the past 48 hour(s)). No results found.  Assessment/Plan:  1. Aftercare following surgery 36 year old female status post I&D of a right lower extremity wound and then opening for hematoma evacuation. Doing much better per patient. Treated with silver nitrate today in clinic. Discussed that this would change the drainage output. She'll follow-up in clinic in 1 week for an additional wound check. Discussed signs and symptoms of worsening infection and for her to return to clinic sooner should they occur.     Ricarda Frameharles Naeemah Jasmer, MD FACS General Surgeon  11/16/2016,11:52 AM

## 2016-11-20 ENCOUNTER — Ambulatory Visit (INDEPENDENT_AMBULATORY_CARE_PROVIDER_SITE_OTHER): Payer: Commercial Managed Care - PPO | Admitting: Surgery

## 2016-11-20 ENCOUNTER — Encounter: Payer: Self-pay | Admitting: Surgery

## 2016-11-20 VITALS — BP 118/82 | HR 75 | Temp 98.3°F | Wt 218.0 lb

## 2016-11-20 DIAGNOSIS — L729 Follicular cyst of the skin and subcutaneous tissue, unspecified: Secondary | ICD-10-CM

## 2016-11-20 NOTE — Patient Instructions (Signed)
Please continue to place a 4x4 gauze to the area at least once a day. We will see next week to make sure that the wound is healing.

## 2016-11-20 NOTE — Progress Notes (Signed)
Outpatient postop visit  11/20/2016  [name] is an 36 y.o. female.    Procedure: Lower extremity cyst excision  CC open wound  HPI: Patient underwent an a cyst excision in the office as subsequent hematoma which spontaneously drained. Patient describes pain since a silver nitrate was applied at the last visit. She has no other problems. Pathology showed epidermal inclusion cyst Medications reviewed.    Physical Exam:  There were no vitals taken for this visit.    PE: No erythema. No expressible purulence. The wound itself is granulating around the edges without signs of infection.    Assessment/Plan:  Patient had stated that she felt pain after the silver nitrate was applied and had not had that pain prior therefore I discussed with her the options of doing nothing at this point and allowing it to heal on its own or adding silver nitrate again to improve the healing process and 2 decrease the time to healing. She did opt for silver nitrate at this point and silver nitrate was applied only around the very edges where the granulation tissue was present. This wound measures approximately 12 mm x 6 mm.  I asked her to follow-up next week for treatment again with silver nitrate.  Lattie Hawichard E Zamyra Allensworth, MD, FACS

## 2016-11-23 ENCOUNTER — Encounter: Payer: Self-pay | Admitting: Surgery

## 2016-11-29 ENCOUNTER — Ambulatory Visit (INDEPENDENT_AMBULATORY_CARE_PROVIDER_SITE_OTHER): Payer: Commercial Managed Care - PPO | Admitting: Surgery

## 2016-11-29 ENCOUNTER — Encounter: Payer: Self-pay | Admitting: Surgery

## 2016-11-29 VITALS — BP 110/78 | HR 80 | Temp 98.0°F | Ht 67.0 in | Wt 216.0 lb

## 2016-11-29 DIAGNOSIS — Z09 Encounter for follow-up examination after completed treatment for conditions other than malignant neoplasm: Secondary | ICD-10-CM

## 2016-11-29 DIAGNOSIS — L729 Follicular cyst of the skin and subcutaneous tissue, unspecified: Secondary | ICD-10-CM

## 2016-11-29 NOTE — Progress Notes (Signed)
11/29/2016  HPI: Patient is s/p excision of right lower extremity subcutaneous cyst on 7/16.  This was complicated by post-op hematoma which was evacuated in ED.  Her wound has been healing well and has not had any worsening pain, purulent drainage, or enlarging size.  Vital signs: BP 110/78   Pulse 80   Temp 98 F (36.7 C) (Oral)   Ht 5\' 7"  (1.702 m)   Wt 98 kg (216 lb)   BMI 33.83 kg/m    Physical Exam: Constitutional:  No acute distress Extremity:  Wound measures about 1.5 cm x 0.5 cm in size.  There is no cellulitis, erythema, or drainage.  Good healthy granulation tissue in wound bed.  Silver nitrate applied to wound  Assessment/Plan: 36 yo female s/p excision of right lower extremity subcutaneous cyst.  --reapplied silver nitrate today.  Dry gauze dressing --wound healing well without further complications.  Patient may continue dressing once daily until wound fully healed.   --Follow up on an as needed basis.   Howie Ill, MD [facility]

## 2016-11-29 NOTE — Patient Instructions (Signed)
GENERAL POST-OPERATIVE PATIENT INSTRUCTIONS   WOUND CARE INSTRUCTIONS:  Keep a dry clean dressing on the wound if there is drainage. The initial bandage may be removed after 24 hours.  Once the wound has quit draining you may leave it open to air.  If clothing rubs against the wound or causes irritation and the wound is not draining you may cover it with a dry dressing during the daytime.  Try to keep the wound dry and avoid ointments on the wound unless directed to do so.  If the wound becomes bright red and painful or starts to drain infected material that is not clear, please contact your physician immediately.  If the wound is mildly pink and has a thick firm ridge underneath it, this is normal, and is referred to as a healing ridge.  This will resolve over the next 4-6 weeks.  BATHING: You may shower if you have been informed of this by your surgeon. However, Please do not submerge in a tub, hot tub, or pool until incisions are completely sealed or have been told by your surgeon that you may do so.  DIET:  You may eat any foods that you can tolerate.  It is a good idea to eat a high fiber diet and take in plenty of fluids to prevent constipation.  If you do become constipated you may want to take a mild laxative or take ducolax tablets on a daily basis until your bowel habits are regular.  Constipation can be very uncomfortable, along with straining, after recent surgery.   MEDICATIONS:  Try to take narcotic medications and anti-inflammatory medications, such as tylenol, ibuprofen, naprosyn, etc., with food.  This will minimize stomach upset from the medication.  Should you develop nausea and vomiting from the pain medication, or develop a rash, please discontinue the medication and contact your physician.  You should not drive, make important decisions, or operate machinery when taking narcotic pain medication.  SUNBLOCK Use sun block to incision area over the next year if this area will be  exposed to sun. This helps decrease scarring and will allow you avoid a permanent darkened area over your incision.  QUESTIONS:  Please feel free to call our office if you have any questions, and we will be glad to assist you. [phone]

## 2017-12-13 ENCOUNTER — Telehealth: Payer: Self-pay | Admitting: Surgery

## 2017-12-13 NOTE — Telephone Encounter (Signed)
Patient is calling and complaining of her right leg swelling from the knee down, she complains of having some pain, pain level being at a seven, patient has taken over the counter tylenol, as well as keeping her leg elevated, patient also stated it gets tight at times. patient said she ended up going to the ER once before and the ER did an u/s in [place]. Please call patient and advise.

## 2017-12-13 NOTE — Telephone Encounter (Signed)
Patient called stating that she was having lower extremity swelling from her right leg. I recommended for the patient to be call her PCP to be seen for this. I told her that we had not seen her in a year and the chances that this could be because of the procedure (excision 04-27-17) she had last year was thin. Patient understood and stated that she will be contacting her PCP.

## 2018-11-19 ENCOUNTER — Ambulatory Visit
Admission: EM | Admit: 2018-11-19 | Discharge: 2018-11-19 | Disposition: A | Discharge status: Home / Self Care | Payer: Commercial Managed Care - PPO | Attending: Family Medicine | Admitting: Family Medicine

## 2018-11-19 ENCOUNTER — Other Ambulatory Visit: Payer: Self-pay

## 2018-11-19 ENCOUNTER — Ambulatory Visit (INDEPENDENT_AMBULATORY_CARE_PROVIDER_SITE_OTHER): Payer: Commercial Managed Care - PPO

## 2018-11-19 DIAGNOSIS — S6991XA Unspecified injury of right wrist, hand and finger(s), initial encounter: Secondary | ICD-10-CM

## 2018-11-19 DIAGNOSIS — M79644 Pain in right finger(s): Secondary | ICD-10-CM

## 2018-11-19 DIAGNOSIS — Y9383 Activity, rough housing and horseplay: Secondary | ICD-10-CM | POA: Diagnosis not present

## 2018-11-19 MED ORDER — MELOXICAM 15 MG PO TABS: 15.0000 mg | ORAL_TABLET | Freq: Every day | ORAL | 0 refills | Status: AC | PRN

## 2018-11-19 NOTE — ED Triage Notes (Signed)
Patient states that she was playing with her kids and somehow did something to her right thumb. Patient states that she has pain that radiates down her wrist. States that she has been unable to move her thumb since yesterday.

## 2018-11-19 NOTE — Discharge Instructions (Signed)
Medication as needed.  Ice.  Brace for comfort.  Take care  Dr. Lacinda Axon

## 2018-11-19 NOTE — ED Provider Notes (Signed)
[facility]    CSN: [id number] Arrival date & time: 11/19/18  1234  History   Chief Complaint Chief Complaint  Patient presents with  . Hand Pain   HPI  38 year old female presents with right thumb pain.  Patient states that she was playing with 1 of her children yesterday and somehow injured her thumb.  She states that she heard a pop.  She reports pain of the MCP joint of the right thumb with radiation down to the wrist.  She reports difficulty moving her thumb.  No medications or interventions tried.  Worse with range of motion.  No relieving factors.  No other complaints.  PMH, Surgical Hx, Family Hx, Social History reviewed and updated as below.  Past Medical History:  Diagnosis Date  . Anxiety and depression 01/10/2011  . Fracture of pelvis (HCC) 01/10/2011  . Heart palpitations 01/19/2016  . Pelvic avulsion fracture (HCC)   . Seizure disorder (HCC) 01/10/2011  . Seizures [facility](HCC)     Patient Active Problem List   Diagnosis Date Noted  . Postoperative infection   . Heart palpitations 01/19/2016  . Anxiety and depression 01/10/2011  . Fracture of pelvis (HCC) 01/10/2011  . Seizure disorder (HCC) 01/10/2011    Past Surgical History:  Procedure Laterality Date  . CESAREAN SECTION    . LEG SURGERY      OB History   No obstetric history on file.      Home Medications    Prior to Admission medications   Medication Sig Start Date End Date Taking? Authorizing Provider  meloxicam (MOBIC) 15 MG tablet Take 1 tablet (15 mg total) by mouth daily as needed. 11/19/18   Tommie Samsook, Kristyna Bradstreet G, DO    Family History Family History  Problem Relation Age of Onset  . Diabetes Mother   . Hypertension Mother   . Asthma Mother   . Seizures Mother   . COPD Mother     Social History Social History   Tobacco Use  . Smoking status: Never Smoker  . Smokeless tobacco: Never Used  Substance Use Topics  . Alcohol use: No  . Drug use: No     Allergies    Diphenhydramine hcl and Oxycodone   Review of Systems Review of Systems  Constitutional: Negative.   Musculoskeletal:       Right thumb pain.   Physical Exam Triage Vital Signs ED Triage Vitals  Enc Vitals Group     BP 11/19/18 1331 125/78     Pulse Rate 11/19/18 1331 (!) 58     Resp 11/19/18 1331 17     Temp 11/19/18 1331 98.4 F (36.9 C)     Temp Source 11/19/18 1331 Oral     SpO2 11/19/18 1331 100 %     Weight 11/19/18 1330 195 lb (88.5 kg)     Height 11/19/18 1330 5\' 8"  (1.727 m)     Head Circumference --      Peak Flow --      Pain Score 11/19/18 1329 7     Pain Loc --      Pain Edu? --      Excl. in GC? --    Updated Vital Signs BP 125/78 (BP Location: Left Arm)   Pulse (!) 58   Temp 98.4 F (36.9 C) (Oral)   Resp 17   Ht 5\' 8"  (1.727 m)   Wt 88.5 kg   LMP 11/02/2018   SpO2 100%   BMI 29.65 kg/m   Visual  Acuity Right Eye Distance:   Left Eye Distance:   Bilateral Distance:    Right Eye Near:   Left Eye Near:    Bilateral Near:     Physical Exam Vitals signs and nursing note reviewed.  Constitutional:      General: She is not in acute distress.    Appearance: Normal appearance.  HENT:     Head: Normocephalic and atraumatic.  Cardiovascular:     Rate and Rhythm: Normal rate and regular rhythm.  Pulmonary:     Effort: Pulmonary effort is normal.     Breath sounds: Normal breath sounds.  Musculoskeletal:     Comments: Right thumb -tenderness to palpation over the MCP joint.  Decreased range of motion secondary to pain.  Mild swelling of the MCP joint.  Neurological:     Mental Status: She is alert.  Psychiatric:        Mood and Affect: Mood normal.        Behavior: Behavior normal.    UC Treatments / Results  Labs (all labs ordered are listed, but only abnormal results are displayed) Labs Reviewed - No data to display  EKG   Radiology Dg Finger Thumb Right  Result Date: 11/19/2018 CLINICAL DATA:  Pain and swelling after hitting  thumb against solid object EXAM: RIGHT THUMB 2+V COMPARISON:  None. FINDINGS: Frontal, oblique, and lateral views were obtained. No fracture or dislocation. Joint spaces appear normal. No erosive change. IMPRESSION: No fracture or dislocation.  No evident arthropathy. Electronically Signed   By: Lowella Grip III M.D.   On: 11/19/2018 14:02    Procedures Procedures (including critical care time)  Medications Ordered in UC Medications - No data to display  Initial Impression / Assessment and Plan / UC Course  I have reviewed the triage vital signs and the nursing notes.  Pertinent labs & imaging results that were available during my care of the patient were reviewed by me and considered in my medical decision making (see chart for details).    38 year old female presents with a right thumb injury.  X-ray negative.  Meloxicam as needed.  Placed and thumb spica splint for comfort.  Final Clinical Impressions(s) / UC Diagnoses   Final diagnoses:  Injury of right thumb, initial encounter     Discharge Instructions     Medication as needed.  Ice.  Brace for comfort.  Take care  Dr. Lacinda Axon    ED Prescriptions    Medication Sig Dispense Auth. Provider   meloxicam (MOBIC) 15 MG tablet Take 1 tablet (15 mg total) by mouth daily as needed. 30 tablet Coral Spikes, DO     Controlled Substance Prescriptions [place] Controlled Substance Registry consulted? Not Applicable   Coral Spikes, DO 11/19/18 1439

## 2018-12-11 ENCOUNTER — Other Ambulatory Visit: Payer: Self-pay | Admitting: Family Medicine

## 2019-02-12 IMAGING — US US EXTREM LOW VENOUS*R*
1 series · 13 of 24 positions shown · non-contrast
Comparison: None.

CLINICAL DATA: Pain and swelling of the lower leg



[Series 2: us extrem low venous*right* · 0.07mm/px · 13 of 31 slices shown]
[im 1/31]
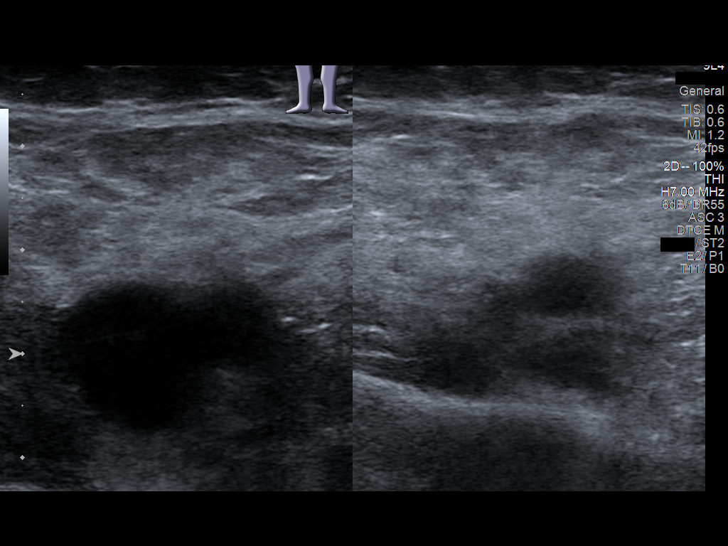
[im 3/31]
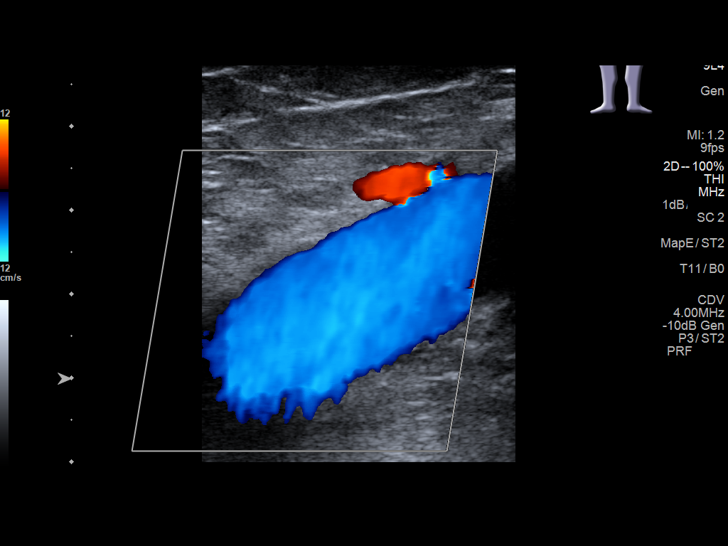
[im 6/31]
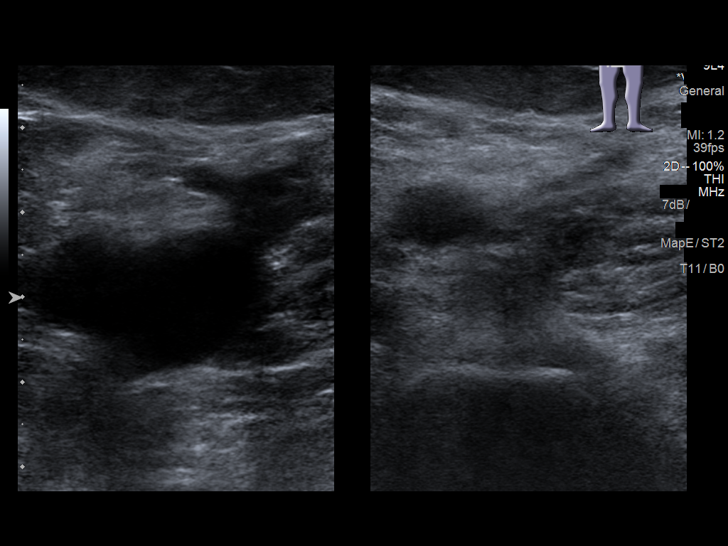
[im 8/31]
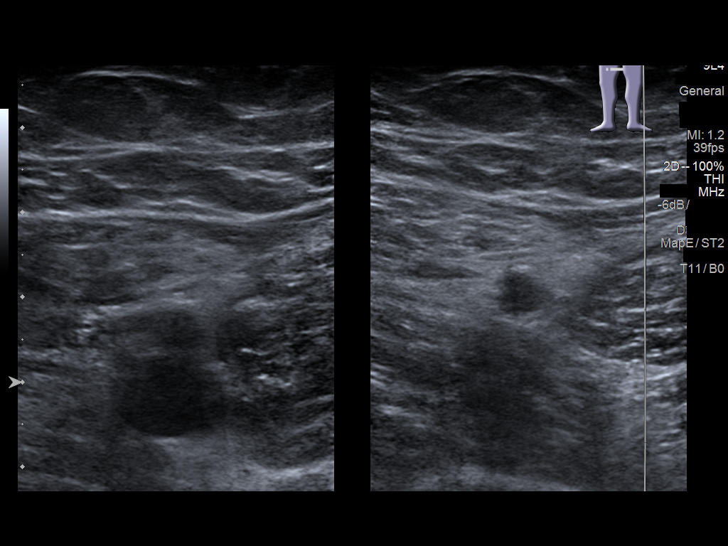
[im 11/31]
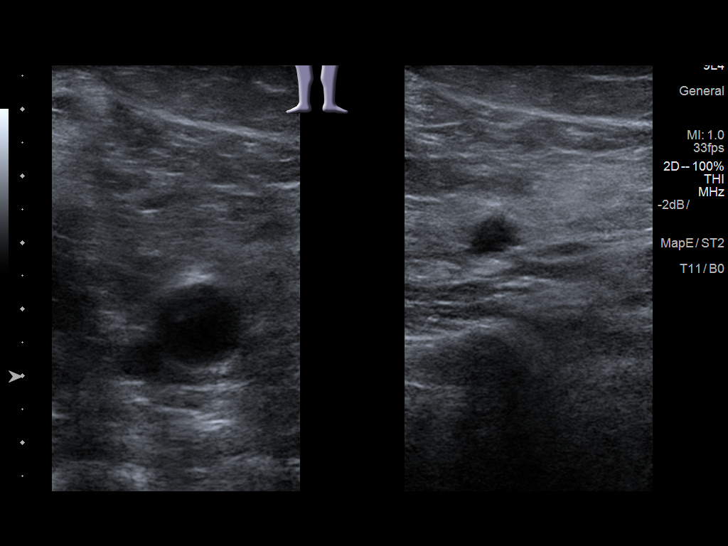
[im 14/31]
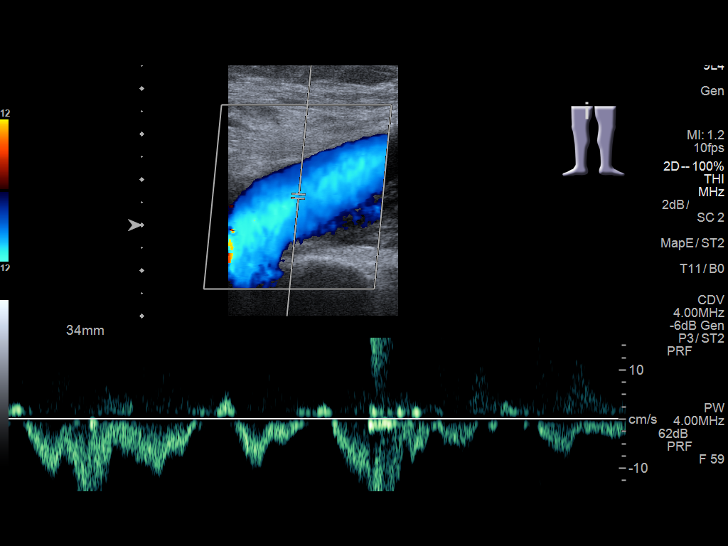
[im 16/31]
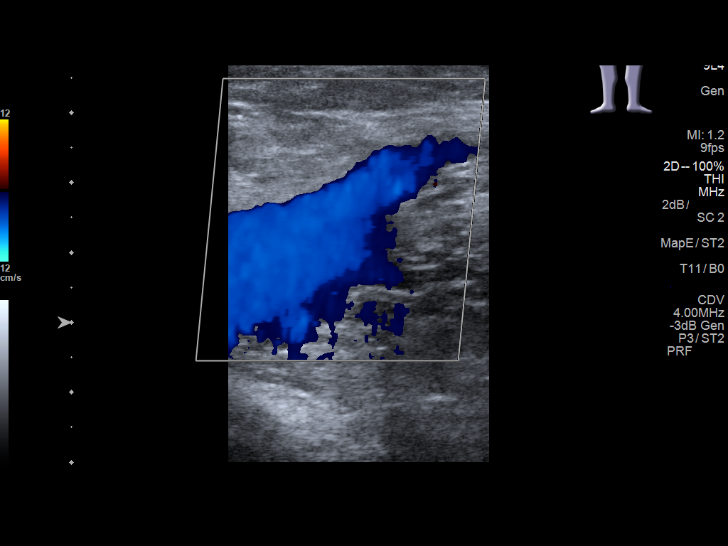
[im 17/31]
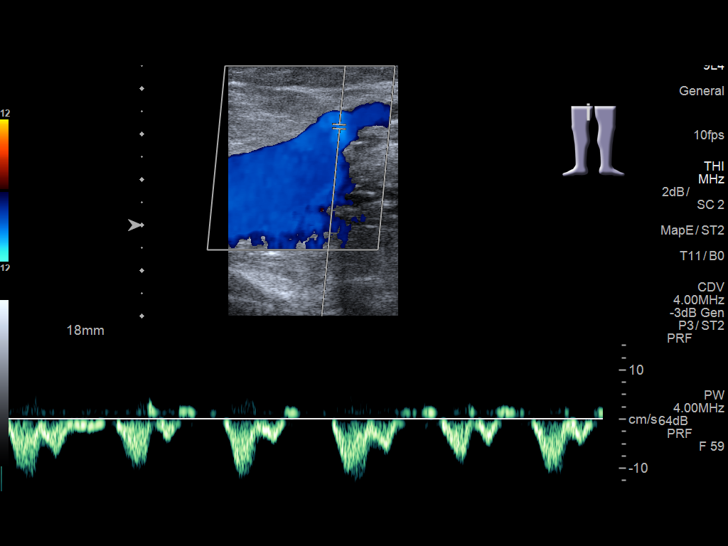
[im 20/31]
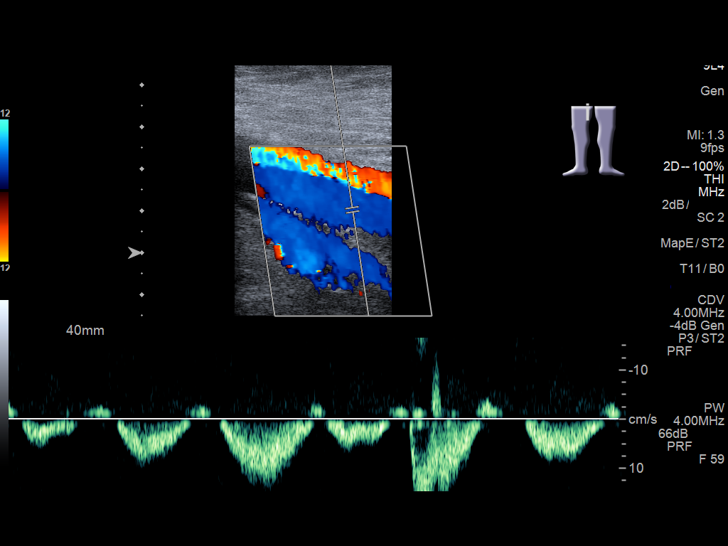
[im 23/31]
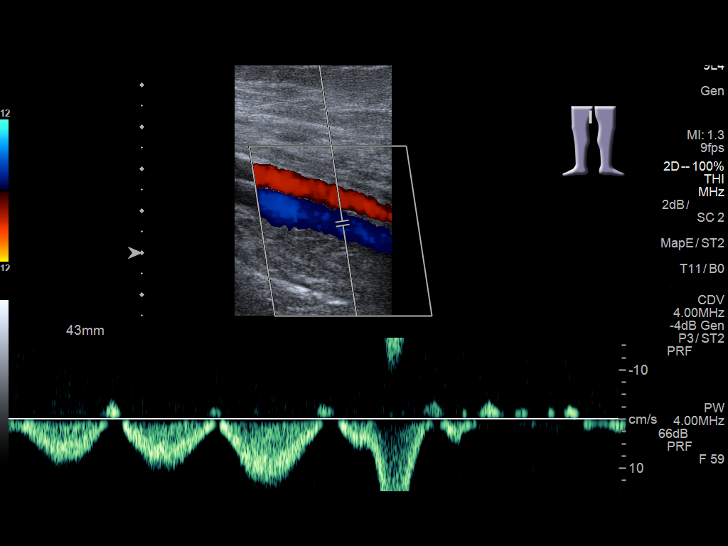
[im 25/31]
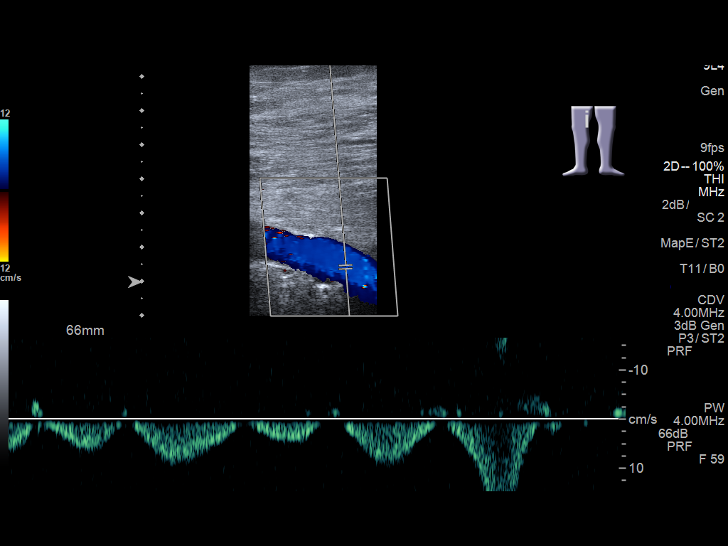
[im 28/31]
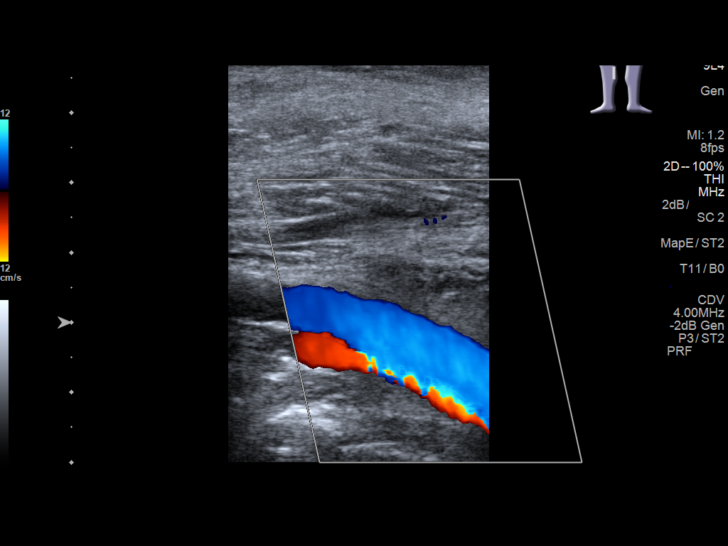
[im 31/31]
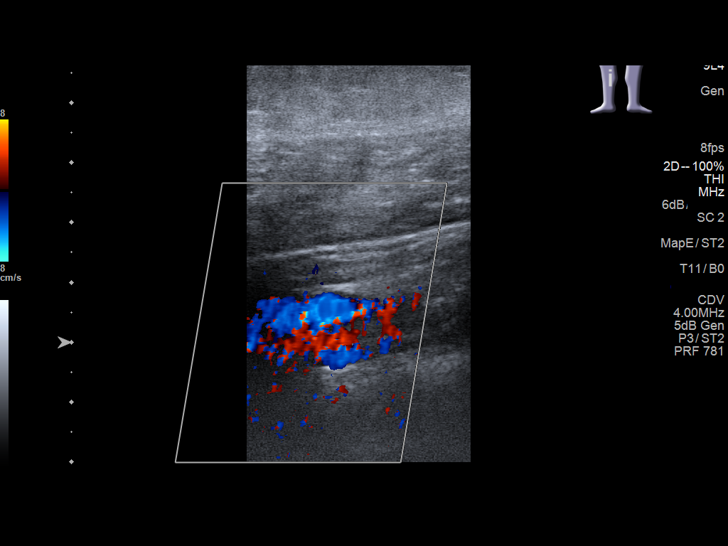

[13 of 24 positions shown; findings below may reference images not displayed]

FINDINGS: Contralateral Common Femoral Vein: Respiratory phasicity is normal
and symmetric with the symptomatic side. No evidence of thrombus.
Normal compressibility.

Common Femoral Vein: No evidence of thrombus. Normal
compressibility, respiratory phasicity and response to augmentation.

Saphenofemoral Junction: No evidence of thrombus. Normal
compressibility and flow on color Doppler imaging.

Profunda Femoral Vein: No evidence of thrombus. Normal
compressibility and flow on color Doppler imaging.

Femoral Vein: No evidence of thrombus. Normal compressibility,
respiratory phasicity and response to augmentation.

Popliteal Vein: No evidence of thrombus. Normal compressibility,
respiratory phasicity and response to augmentation.

Calf Veins: No evidence of thrombus. Normal compressibility and flow
on color Doppler imaging.

Superficial Great Saphenous Vein: No evidence of thrombus. Normal
compressibility and flow on color Doppler imaging.

Venous Reflux:  None.

Other Findings:  None.
IMPRESSION: No  DVT within the right lower extremity.

## 2019-04-29 ENCOUNTER — Ambulatory Visit: Payer: Self-pay | Attending: Internal Medicine

## 2019-04-29 DIAGNOSIS — Z20822 Contact with and (suspected) exposure to covid-19: Secondary | ICD-10-CM | POA: Insufficient documentation

## 2019-05-01 LAB — NOVEL CORONAVIRUS, NAA: SARS-CoV-2, NAA: NOT DETECTED

## 2019-07-11 ENCOUNTER — Ambulatory Visit: Payer: Self-pay | Attending: Internal Medicine

## 2019-07-11 DIAGNOSIS — Z23 Encounter for immunization: Secondary | ICD-10-CM

## 2019-07-11 NOTE — Progress Notes (Signed)
   Covid-19 Vaccination Clinic  Name:  Shelby Myers    MRN: [id number] DOB: 03-06-1981  07/11/2019  [name] was observed post Covid-19 immunization for 15 minutes without incident. She was provided with Vaccine Information Sheet and instruction to access the V-Safe system.   [name] was instructed to call 911 with any severe reactions post vaccine: Marland Kitchen Difficulty breathing  . Swelling of face and throat  . A fast heartbeat  . A bad rash all over body  . Dizziness and weakness   Immunizations Administered    Name Date Dose VIS Date Route   Pfizer COVID-19 Vaccine 07/11/2019  1:16 PM 0.3 mL 03/21/2019 Intramuscular   Manufacturer: [employer], [employer]   Lot: [id number]   NDC: [address]-0

## 2019-08-05 ENCOUNTER — Ambulatory Visit: Payer: Self-pay | Attending: Internal Medicine

## 2019-08-05 DIAGNOSIS — Z23 Encounter for immunization: Secondary | ICD-10-CM

## 2019-08-05 NOTE — Progress Notes (Signed)
   Covid-19 Vaccination Clinic  Name:  Shelby Myers    MRN: [id number] DOB: 1980/07/31  08/05/2019  [name] was observed post Covid-19 immunization for 15 minutes without incident. She was provided with Vaccine Information Sheet and instruction to access the V-Safe system.   [name] was instructed to call 911 with any severe reactions post vaccine: Marland Kitchen Difficulty breathing  . Swelling of face and throat  . A fast heartbeat  . A bad rash all over body  . Dizziness and weakness   Immunizations Administered    Name Date Dose VIS Date Route   Pfizer COVID-19 Vaccine 08/05/2019 12:57 PM 0.3 mL 06/04/2018 Intramuscular   Manufacturer: [employer], [employer]   Lot: [id number]   NDC: [address]-2

## 2021-07-15 IMAGING — CR RIGHT THUMB 2+V
3 series · 3 of 3 positions shown · non-contrast
Comparison: None.

CLINICAL DATA: Pain and swelling after hitting thumb against solid
object

EXAM:
RIGHT THUMB 2+V

[finger ap]
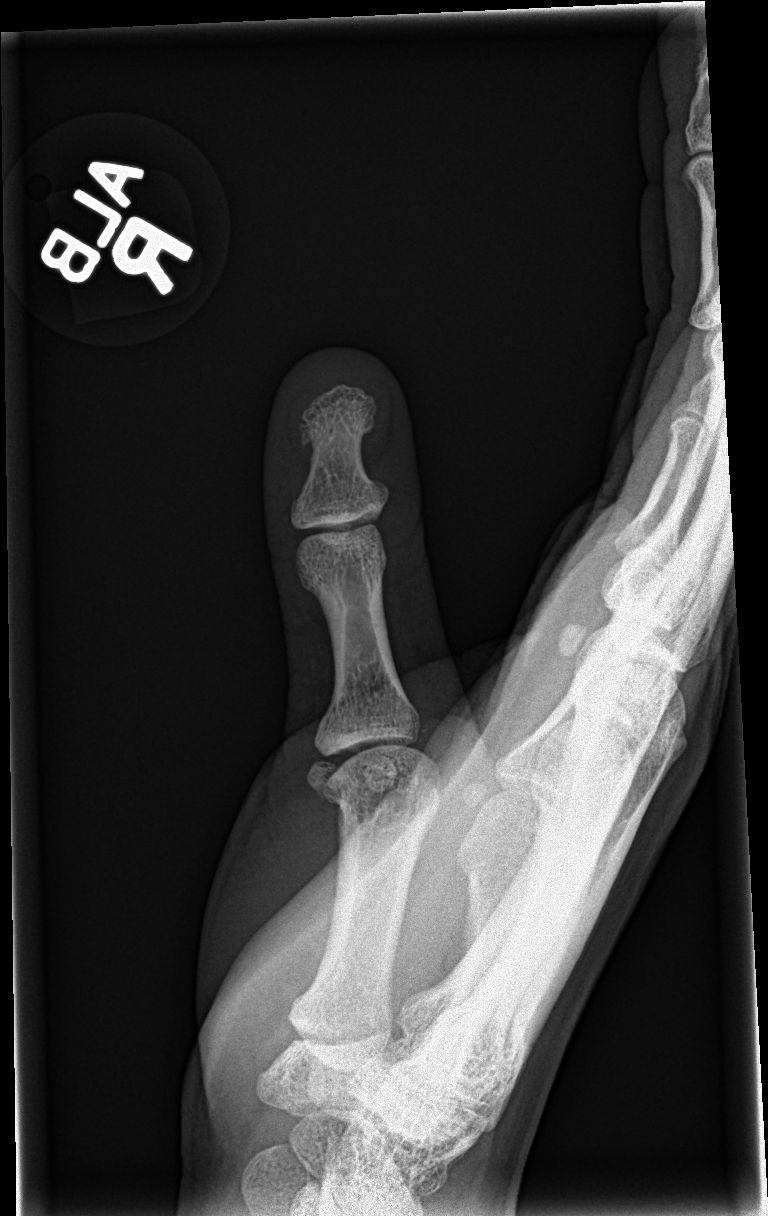

[finger obl]
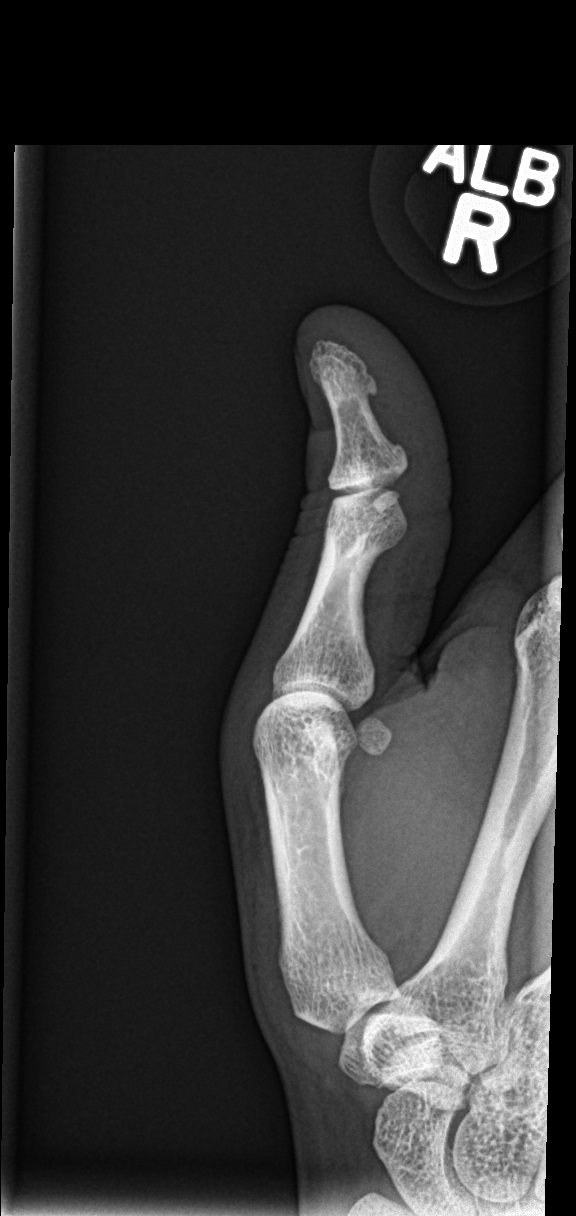

[finger lat]
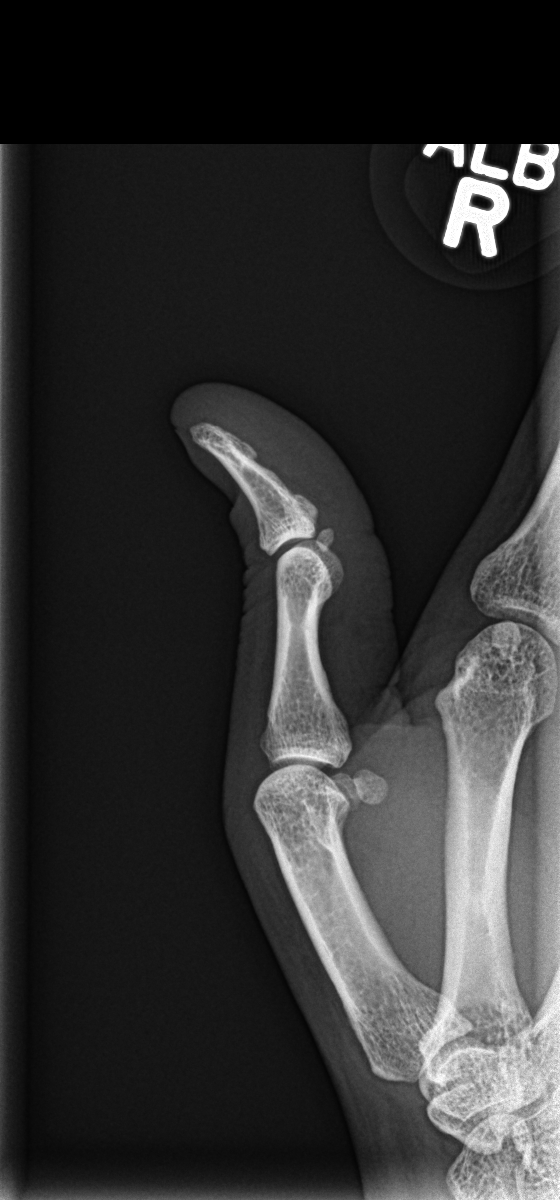

[3 of 3 positions shown; findings below may reference images not displayed]

FINDINGS: Frontal, oblique, and lateral views were obtained. No fracture or
dislocation. Joint spaces appear normal. No erosive change.
IMPRESSION: No fracture or dislocation.  No evident arthropathy.

## 2022-11-09 ENCOUNTER — Emergency Department
Admission: EM | Admit: 2022-11-09 | Discharge: 2022-11-09 | Disposition: A | Discharge status: Home / Self Care | Payer: Self-pay | Attending: Emergency Medicine | Admitting: Emergency Medicine

## 2022-11-09 ENCOUNTER — Emergency Department: Payer: Self-pay

## 2022-11-09 ENCOUNTER — Other Ambulatory Visit: Payer: Self-pay

## 2022-11-09 DIAGNOSIS — G40909 Epilepsy, unspecified, not intractable, without status epilepticus: Secondary | ICD-10-CM | POA: Insufficient documentation

## 2022-11-09 DIAGNOSIS — J019 Acute sinusitis, unspecified: Secondary | ICD-10-CM | POA: Insufficient documentation

## 2022-11-09 DIAGNOSIS — E876 Hypokalemia: Secondary | ICD-10-CM | POA: Insufficient documentation

## 2022-11-09 DIAGNOSIS — Z20822 Contact with and (suspected) exposure to covid-19: Secondary | ICD-10-CM | POA: Insufficient documentation

## 2022-11-09 DIAGNOSIS — R569 Unspecified convulsions: Secondary | ICD-10-CM

## 2022-11-09 LAB — URINE DRUG SCREEN, QUALITATIVE (ARMC ONLY)
Amphetamines, Ur Screen: NOT DETECTED
Barbiturates, Ur Screen: NOT DETECTED
Benzodiazepine, Ur Scrn: NOT DETECTED
Cannabinoid 50 Ng, Ur ~~LOC~~: POSITIVE — AB
Cocaine Metabolite,Ur ~~LOC~~: NOT DETECTED
MDMA (Ecstasy)Ur Screen: NOT DETECTED
Methadone Scn, Ur: NOT DETECTED
Opiate, Ur Screen: NOT DETECTED
Phencyclidine (PCP) Ur S: NOT DETECTED
Tricyclic, Ur Screen: NOT DETECTED

## 2022-11-09 LAB — COMPREHENSIVE METABOLIC PANEL
ALT: 14 U/L (ref 0–44)
AST: 13 U/L — ABNORMAL LOW (ref 15–41)
Albumin: 3.5 g/dL (ref 3.5–5.0)
Alkaline Phosphatase: 78 U/L (ref 38–126)
Anion gap: 7 (ref 5–15)
BUN: 13 mg/dL (ref 6–20)
CO2: 23 mmol/L (ref 22–32)
Calcium: 8.6 mg/dL — ABNORMAL LOW (ref 8.9–10.3)
Chloride: 107 mmol/L (ref 98–111)
Creatinine, Ser: 0.65 mg/dL (ref 0.44–1.00)
GFR, Estimated: >60 mL/min (ref >60)
Glucose, Bld: 116 mg/dL — ABNORMAL HIGH (ref 70–99)
Potassium: 3.3 mmol/L — ABNORMAL LOW (ref 3.5–5.1)
Sodium: 137 mmol/L (ref 135–145)
Total Bilirubin: 0.8 mg/dL (ref 0.3–1.2)
Total Protein: 6.8 g/dL (ref 6.5–8.1)

## 2022-11-09 LAB — POC URINE PREG, ED: Preg Test, Ur: NEGATIVE

## 2022-11-09 LAB — CBC
HCT: 32.3 % — ABNORMAL LOW (ref 36.0–46.0)
Hemoglobin: 10.6 g/dL — ABNORMAL LOW (ref 12.0–15.0)
MCH: 31.5 pg (ref 26.0–34.0)
MCHC: 32.8 g/dL (ref 30.0–36.0)
MCV: 95.8 fL (ref 80.0–100.0)
Platelets: 194 10*3/uL (ref 150–400)
RBC: 3.37 MIL/uL — ABNORMAL LOW (ref 3.87–5.11)
RDW: 11.9 % (ref 11.5–15.5)
WBC: 6.7 10*3/uL (ref 4.0–10.5)
nRBC: 0 % (ref 0.0–0.2)

## 2022-11-09 LAB — URINALYSIS, ROUTINE W REFLEX MICROSCOPIC
Bacteria, UA: NONE SEEN
Bilirubin Urine: NEGATIVE
Glucose, UA: NEGATIVE mg/dL
Hgb urine dipstick: NEGATIVE
Ketones, ur: NEGATIVE mg/dL
Leukocytes,Ua: NEGATIVE
Nitrite: NEGATIVE
Protein, ur: 30 mg/dL — AB
Specific Gravity, Urine: 1.026 (ref 1.005–1.030)
pH: 5 (ref 5.0–8.0)

## 2022-11-09 LAB — ETHANOL: Alcohol, Ethyl (B): <10 mg/dL (ref <10)

## 2022-11-09 LAB — SARS CORONAVIRUS 2 BY RT PCR: SARS Coronavirus 2 by RT PCR: NEGATIVE

## 2022-11-09 MED ORDER — POTASSIUM CHLORIDE CRYS ER 20 MEQ PO TBCR
40.0000 meq | EXTENDED_RELEASE_TABLET | Freq: Once | ORAL | Status: AC
  Administered 2022-11-09: 40 meq via ORAL
  Filled 2022-11-09: qty 2

## 2022-11-09 MED ORDER — LORAZEPAM 2 MG/ML IJ SOLN
0.5000 mg | Freq: Once | INTRAMUSCULAR | Status: AC
  Administered 2022-11-09: 0.5 mg via INTRAVENOUS
  Filled 2022-11-09: qty 1

## 2022-11-09 MED ORDER — SODIUM CHLORIDE 0.9 % IV BOLUS
1000.0000 mL | Freq: Once | INTRAVENOUS | Status: AC
  Administered 2022-11-09: 1000 mL via INTRAVENOUS

## 2022-11-09 MED ORDER — AMOXICILLIN-POT CLAVULANATE 875-125 MG PO TABS: 1.0000 | ORAL_TABLET | Freq: Two times a day (BID) | ORAL | 0 refills | Status: AC

## 2022-11-09 MED ORDER — AMOXICILLIN-POT CLAVULANATE 875-125 MG PO TABS
1.0000 | ORAL_TABLET | Freq: Once | ORAL | Status: AC
  Administered 2022-11-09: 1 via ORAL
  Filled 2022-11-09: qty 1

## 2022-11-09 NOTE — Discharge Instructions (Addendum)
Take and finish antibiotic as prescribed.  Do not drive or operate heavy machinery until seen by the neurologist.  Return to the ER for recurrent or worsening symptoms, persistent vomiting, difficulty breathing or other concerns.

## 2022-11-09 NOTE — ED Triage Notes (Signed)
Pt presents to the ED via EMS c/o seizures. Pt states she has not had a seizure in 11 years, denies taking medication for seizures or having seen a neurologist in "a long time." Pt states she had a seizure that was witnessed by her husband. Pt denies falling to the ground or hitting her head. Pt A&Ox4 at this time.

## 2022-11-09 NOTE — ED Provider Notes (Signed)
Shelby Myers    Event Date/Time   First MD Initiated Contact with Patient 11/09/22 0118     (approximate)   History   Seizure   HPI  Shelby Myers is a 42 y.o. female brought to the ED via EMS from home with a chief complaint of seizure.  Patient with a history of seizures, last one 11 years ago.  She was never placed on AEDs as she would have 1 seizure every 2 years.  Did have neurology evaluation.  Patient felt an aura, was clammy, dizzy.  Husband witnessed brief tonic-clonic seizure.  Patient did not bite tongue or suffer urinary incontinence.  EMS reports patient nauseous and pale at the scene, given 300 cc IV normal saline and 4 mg IV Zofran.  No complaints on arrival.  Denies recent fever/chills, headache, vision changes, chest pain, shortness of breath, abdominal pain, nausea, vomiting or dysuria.  Endorses slight cough recently.  Denies recent trauma.     Past Medical History   Past Medical History:  Diagnosis Date   Anxiety and depression 01/10/2011   Fracture of pelvis (HCC) 01/10/2011   Heart palpitations 01/19/2016   Pelvic avulsion fracture (HCC)    Seizure disorder (HCC) 01/10/2011   Seizures (HCC)      Active Problem List   Patient Active Problem List   Diagnosis Date Noted   Postoperative infection    Heart palpitations 01/19/2016   Anxiety and depression 01/10/2011   Fracture of pelvis (HCC) 01/10/2011   Seizure disorder (HCC) 01/10/2011     Past Surgical History   Past Surgical History:  Procedure Laterality Date   CESAREAN SECTION     HERNIA REPAIR     LEG SURGERY       Home Medications   Prior to Admission medications   Medication Sig Start Date End Date Taking? Authorizing Provider  amoxicillin-clavulanate (AUGMENTIN) 875-125 MG tablet Take 1 tablet by mouth 2 (two) times daily. 11/09/22  Yes Irean Hong, MD  meloxicam (MOBIC) 15 MG tablet Take 1 tablet (15 mg total) by mouth daily as needed. 11/19/18    Tommie Sams, DO     Allergies  Diphenhydramine hcl and Oxycodone   Family History   Family History  Problem Relation Age of Onset   Diabetes Mother    Hypertension Mother    Asthma Mother    Seizures Mother    COPD Mother      Physical Exam  Triage Vital Signs: ED Triage Vitals  Encounter Vitals Group     BP      Systolic BP Percentile      Diastolic BP Percentile      Pulse      Resp      Temp      Temp src      SpO2      Weight      Height      Head Circumference      Peak Flow      Pain Score      Pain Loc      Pain Education      Exclude from Growth Chart     Updated Vital Signs: BP 118/82 (BP Location: Right Arm)   Pulse 64   Temp 97.7 F (36.5 C) (Oral)   Resp (!) 24   Ht 5\' 7"  (1.702 m)   Wt 113.5 kg   LMP 09/21/2022 (Approximate)   SpO2 98%   BMI 39.19  kg/m    General: Awake, mild distress.  CV:  RRR.  Good peripheral perfusion.  Resp:  Normal effort.  CTAB. Abd:  No distention.  Other:  Alert and oriented x 3.  CN II-XII grossly intact.  5/5 motor strength and sensation all extremities.  M AE x 4.   ED Results / Procedures / Treatments  Labs (all labs ordered are listed, but only abnormal results are displayed) Labs Reviewed  CBC - Abnormal; Notable for the following components:      Result Value   RBC 3.37 (*)    Hemoglobin 10.6 (*)    HCT 32.3 (*)    All other components within normal limits  COMPREHENSIVE METABOLIC PANEL - Abnormal; Notable for the following components:   Potassium 3.3 (*)    Glucose, Bld 116 (*)    Calcium 8.6 (*)    AST 13 (*)    All other components within normal limits  URINALYSIS, ROUTINE W REFLEX MICROSCOPIC - Abnormal; Notable for the following components:   Color, Urine YELLOW (*)    APPearance HAZY (*)    Protein, ur 30 (*)    All other components within normal limits  URINE DRUG SCREEN, QUALITATIVE ([facility] ONLY) - Abnormal; Notable for the following components:   Cannabinoid 50 Ng, Ur [place]  POSITIVE (*)    All other components within normal limits  SARS CORONAVIRUS 2 BY RT PCR  ETHANOL  POC URINE PREG, ED     EKG  ED ECG REPORT I, Talyah Seder J, the attending physician, personally viewed and interpreted this ECG.   Date: 11/09/2022  EKG Time: 0125  Rate: 57  Rhythm: normal sinus rhythm  Axis: Normal  Intervals:right bundle branch block  ST&T Change: Nonspecific    RADIOLOGY I have independently visualized and interpreted patient's CT and chest x-ray as well as noted the radiology interpretation:  CT head: No ICH, extensive sinusitis, recommend MRI  Chest x-ray: No acute cardiopulmonary process  MRI brain: Normal brain, active sinusitis  Official radiology report(s): MR BRAIN WO CONTRAST  Result Date: 11/09/2022 CLINICAL DATA:  Seizure.  Extensive sinus disease by CT EXAM: MRI HEAD WITHOUT CONTRAST TECHNIQUE: Multiplanar, multiecho pulse sequences of the brain and surrounding structures were obtained without intravenous contrast. COMPARISON:  Head CT from earlier today. FINDINGS: Brain: No infarction, hemorrhage, hydrocephalus, extra-axial collection or mass lesion. No cortical finding to correlate with seizure focus, including at the bilateral hippocampus. Vascular: Normal flow voids. Skull and upper cervical spine: Normal marrow signal. Sinuses/Orbits: Generalized paranasal sinus opacification greatest at the right posterior ethmoid and sphenoid sinuses. Negative orbits. IMPRESSION: 1. Normal MRI of the brain. 2. Active, generalized sinusitis. Electronically Signed   By: Tiburcio Pea M.D.   On: 11/09/2022 05:20   CT Head Wo Contrast  Result Date: 11/09/2022 CLINICAL DATA:  Seizures. EXAM: CT HEAD WITHOUT CONTRAST TECHNIQUE: Contiguous axial images were obtained from the base of the skull through the vertex without intravenous contrast. RADIATION DOSE REDUCTION: This exam was performed according to the departmental dose-optimization program which includes automated  exposure control, adjustment of the mA and/or kV according to patient size and/or use of iterative reconstruction technique. COMPARISON:  None Available. FINDINGS: Brain: No evidence of acute infarction, hemorrhage, hydrocephalus, extra-axial collection or mass lesion/mass effect. A partially empty sella is seen. Vascular: No hyperdense vessel or unexpected calcification. Skull: Normal. Negative for fracture or focal lesion. Sinuses/Orbits: There are small bilateral maxillary sinus air-fluid levels. Moderate to marked severity sphenoid sinus and bilateral  ethmoid sinus mucosal thickening is seen. Other: None. IMPRESSION: 1. No acute intracranial abnormality. 2. Moderate to marked severity paranasal sinus disease. Correlation with MRI is recommended. Electronically Signed   By: Aram Candela M.D.   On: 11/09/2022 02:13   DG Chest Port 1 View  Result Date: 11/09/2022 CLINICAL DATA:  Status post seizure. EXAM: PORTABLE CHEST 1 VIEW COMPARISON:  September 12, 2015 FINDINGS: The heart size and mediastinal contours are within normal limits. Low lung volumes are noted. Both lungs are clear. The visualized skeletal structures are unremarkable. IMPRESSION: No active disease. Electronically Signed   By: Aram Candela M.D.   On: 11/09/2022 01:34     PROCEDURES:  Critical Care performed: No  .1-3 Lead EKG Interpretation  Performed by: Irean Hong, MD Authorized by: Irean Hong, MD     Interpretation: normal     ECG rate:  60   ECG rate assessment: normal     Rhythm: sinus rhythm     Ectopy: none     Conduction: normal   Comments:     Patient placed on cardiac monitor to evaluate for arrhythmias    MEDICATIONS ORDERED IN ED: Medications  sodium chloride 0.9 % bolus 1,000 mL (0 mLs Intravenous Stopped 11/09/22 0353)  LORazepam (ATIVAN) injection 0.5 mg (0.5 mg Intravenous Given 11/09/22 0152)  potassium chloride SA (KLOR-CON M) CR tablet 40 mEq (40 mEq Oral Given 11/09/22 0359)  amoxicillin-clavulanate  (AUGMENTIN) 875-125 MG per tablet 1 tablet (1 tablet Oral Given 11/09/22 0557)     IMPRESSION / MDM / ASSESSMENT AND PLAN / ED COURSE  I reviewed the triage vital signs and the nursing notes.                             42 year old female presenting with seizure. Differential diagnosis includes, but is not limited to, alcohol, illicit or prescription medications, or other toxic ingestion; intracranial pathology such as stroke or intracerebral hemorrhage; fever or infectious causes including sepsis; hypoxemia and/or hypercarbia; uremia; trauma; endocrine related disorders such as diabetes, hypoglycemia, and thyroid-related diseases; hypertensive encephalopathy; etc. I personally reviewed patient's records and Myers a general surgery office visit from November 2023 status post recurrent ventral hernia repair.  Patient's presentation is most consistent with acute presentation with potential threat to life or bodily function.  The patient is on the cardiac monitor to evaluate for evidence of arrhythmia and/or significant heart rate changes.  Will obtain lab work, urine, CT head, chest x-ray.  Placed on seizure precautions.  Administer IV fluids, low-dose IV Ativan and reassess.  Clinical Course as of 11/09/22 0629  Thu Nov 09, 2022  0350 Husband at bedside.  Patient groggy from Ativan.  Updated them of all test results.  Husband states patient has been having sinus congestion.  Given results of CT head and recommendation from radiology, will proceed with MRI.  States she last smoked marijuana over 1 week ago. [JS]  0537 MRI normal brain, sinusitis.  Will place on antibiotics and refer to neurology, no AEDs at this time.  Strict return precautions given.  Patient and spouse verbalized understanding agree with plan of care. [JS]    Clinical Course User Index [JS] Irean Hong, MD     FINAL CLINICAL IMPRESSION(S) / ED DIAGNOSES   Final diagnoses:  Seizure (HCC)  Hypokalemia  Acute sinusitis,  recurrence not specified, unspecified location     Rx / DC Orders   ED  Discharge Orders          Ordered    amoxicillin-clavulanate (AUGMENTIN) 875-125 MG tablet  2 times daily        11/09/22 0540             Myers:  This document was prepared using Dragon voice recognition software and may include unintentional dictation errors.   Irean Hong, MD 11/09/22 0630
